# Patient Record
Sex: Female | Born: 1955 | Race: Black or African American | Hispanic: No | Marital: Married | Smoking: Never smoker
Health system: Southern US, Community
[De-identification: ages and names within clinical notes are randomized; demographics above are authoritative.]

## PROBLEM LIST (undated history)

## (undated) DIAGNOSIS — O3410 Maternal care for benign tumor of corpus uteri, unspecified trimester: Secondary | ICD-10-CM

## (undated) DIAGNOSIS — M1990 Unspecified osteoarthritis, unspecified site: Secondary | ICD-10-CM

## (undated) DIAGNOSIS — D259 Leiomyoma of uterus, unspecified: Secondary | ICD-10-CM

## (undated) DIAGNOSIS — T7840XA Allergy, unspecified, initial encounter: Secondary | ICD-10-CM

## (undated) DIAGNOSIS — N83209 Unspecified ovarian cyst, unspecified side: Secondary | ICD-10-CM

## (undated) HISTORY — PX: POPLITEAL SYNOVIAL CYST EXCISION: SUR555

## (undated) HISTORY — PX: TUBAL LIGATION: SHX77

## (undated) HISTORY — DX: Unspecified ovarian cyst, unspecified side: N83.209

## (undated) HISTORY — DX: Maternal care for benign tumor of corpus uteri, unspecified trimester: D25.9

## (undated) HISTORY — DX: Allergy, unspecified, initial encounter: T78.40XA

## (undated) HISTORY — DX: Leiomyoma of uterus, unspecified: O34.10

## (undated) HISTORY — DX: Unspecified osteoarthritis, unspecified site: M19.90

---

## 2004-12-31 ENCOUNTER — Ambulatory Visit: Payer: Self-pay | Admitting: Family Medicine

## 2005-01-07 HISTORY — PX: LEEP: SHX91

## 2006-05-19 ENCOUNTER — Other Ambulatory Visit: Admission: RE | Admit: 2006-05-19 | Discharge: 2006-05-19 | Payer: Self-pay | Admitting: Family Medicine

## 2006-05-19 ENCOUNTER — Encounter: Payer: Self-pay | Admitting: Family Medicine

## 2006-05-19 ENCOUNTER — Ambulatory Visit: Payer: Self-pay | Admitting: Family Medicine

## 2006-05-19 LAB — CONVERTED CEMR LAB: Pap Smear: NORMAL

## 2007-03-12 ENCOUNTER — Encounter: Payer: Self-pay | Admitting: Family Medicine

## 2007-03-12 ENCOUNTER — Telehealth: Payer: Self-pay | Admitting: Family Medicine

## 2007-03-12 ENCOUNTER — Ambulatory Visit (HOSPITAL_COMMUNITY): Admission: RE | Admit: 2007-03-12 | Discharge: 2007-03-12 | Payer: Self-pay | Admitting: Family Medicine

## 2007-03-12 ENCOUNTER — Ambulatory Visit: Payer: Self-pay | Admitting: Vascular Surgery

## 2007-03-12 ENCOUNTER — Ambulatory Visit: Payer: Self-pay | Admitting: Family Medicine

## 2007-03-18 ENCOUNTER — Ambulatory Visit: Payer: Self-pay | Admitting: Orthopedic Surgery

## 2007-04-23 ENCOUNTER — Encounter: Payer: Self-pay | Admitting: Family Medicine

## 2007-04-23 DIAGNOSIS — J309 Allergic rhinitis, unspecified: Secondary | ICD-10-CM | POA: Insufficient documentation

## 2007-04-23 DIAGNOSIS — J45909 Unspecified asthma, uncomplicated: Secondary | ICD-10-CM | POA: Insufficient documentation

## 2007-08-03 ENCOUNTER — Encounter: Payer: Self-pay | Admitting: Family Medicine

## 2007-08-03 ENCOUNTER — Ambulatory Visit: Payer: Self-pay | Admitting: Family Medicine

## 2007-08-03 ENCOUNTER — Other Ambulatory Visit: Admission: RE | Admit: 2007-08-03 | Discharge: 2007-08-03 | Payer: Self-pay | Admitting: Family Medicine

## 2007-08-03 DIAGNOSIS — N852 Hypertrophy of uterus: Secondary | ICD-10-CM | POA: Insufficient documentation

## 2007-08-04 LAB — CONVERTED CEMR LAB
ALT: 16 units/L (ref 0–35)
AST: 19 units/L (ref 0–37)
Albumin: 3.7 g/dL (ref 3.5–5.2)
Alkaline Phosphatase: 50 units/L (ref 39–117)
BUN: 8 mg/dL (ref 6–23)
Basophils Absolute: 0 10*3/uL (ref 0.0–0.1)
Basophils Relative: 0.4 % (ref 0.0–1.0)
Bilirubin, Direct: 0.1 mg/dL (ref 0.0–0.3)
CO2: 29 meq/L (ref 19–32)
Calcium: 9.1 mg/dL (ref 8.4–10.5)
Chloride: 102 meq/L (ref 96–112)
Cholesterol: 172 mg/dL (ref 0–200)
Creatinine, Ser: 0.7 mg/dL (ref 0.4–1.2)
Eosinophils Absolute: 0.2 10*3/uL (ref 0.0–0.6)
Eosinophils Relative: 6.5 % — ABNORMAL HIGH (ref 0.0–5.0)
GFR calc Af Amer: 114 mL/min
GFR calc non Af Amer: 95 mL/min
Glucose, Bld: 81 mg/dL (ref 70–99)
HCT: 40.3 % (ref 36.0–46.0)
HDL: 63.9 mg/dL (ref >39.0)
Hemoglobin: 13.8 g/dL (ref 12.0–15.0)
LDL Cholesterol: 95 mg/dL (ref 0–99)
Lymphocytes Relative: 32 % (ref 12.0–46.0)
MCHC: 34.1 g/dL (ref 30.0–36.0)
MCV: 91.3 fL (ref 78.0–100.0)
Monocytes Absolute: 0.4 10*3/uL (ref 0.2–0.7)
Monocytes Relative: 9.6 % (ref 3.0–11.0)
Neutro Abs: 2 10*3/uL (ref 1.4–7.7)
Neutrophils Relative %: 51.5 % (ref 43.0–77.0)
Platelets: 296 10*3/uL (ref 150–400)
Potassium: 3.7 meq/L (ref 3.5–5.1)
RBC: 4.42 M/uL (ref 3.87–5.11)
RDW: 11.5 % (ref 11.5–14.6)
Sodium: 138 meq/L (ref 135–145)
TSH: 1.13 microintl units/mL (ref 0.35–5.50)
Total Bilirubin: 0.6 mg/dL (ref 0.3–1.2)
Total CHOL/HDL Ratio: 2.7
Total Protein: 6.8 g/dL (ref 6.0–8.3)
Triglycerides: 64 mg/dL (ref 0–149)
VLDL: 13 mg/dL (ref 0–40)
WBC: 3.8 10*3/uL — ABNORMAL LOW (ref 4.5–10.5)

## 2007-08-10 ENCOUNTER — Encounter (INDEPENDENT_AMBULATORY_CARE_PROVIDER_SITE_OTHER): Payer: Self-pay | Admitting: *Deleted

## 2007-08-10 LAB — CONVERTED CEMR LAB: Pap Smear: NORMAL

## 2007-08-28 ENCOUNTER — Encounter: Payer: Self-pay | Admitting: Family Medicine

## 2007-08-29 DIAGNOSIS — D259 Leiomyoma of uterus, unspecified: Secondary | ICD-10-CM | POA: Insufficient documentation

## 2007-10-29 ENCOUNTER — Encounter: Payer: Self-pay | Admitting: Family Medicine

## 2007-11-02 ENCOUNTER — Encounter (INDEPENDENT_AMBULATORY_CARE_PROVIDER_SITE_OTHER): Payer: Self-pay | Admitting: *Deleted

## 2007-11-17 ENCOUNTER — Telehealth: Payer: Self-pay | Admitting: Family Medicine

## 2007-11-27 ENCOUNTER — Encounter: Payer: Self-pay | Admitting: Family Medicine

## 2008-02-15 ENCOUNTER — Ambulatory Visit: Payer: Self-pay | Admitting: Family Medicine

## 2008-02-16 ENCOUNTER — Ambulatory Visit: Payer: Self-pay | Admitting: Family Medicine

## 2008-02-25 ENCOUNTER — Ambulatory Visit: Payer: Self-pay | Admitting: Family Medicine

## 2008-08-12 ENCOUNTER — Ambulatory Visit: Payer: Self-pay | Admitting: Internal Medicine

## 2008-10-07 ENCOUNTER — Ambulatory Visit: Payer: Self-pay | Admitting: Family Medicine

## 2008-10-07 ENCOUNTER — Other Ambulatory Visit: Admission: RE | Admit: 2008-10-07 | Discharge: 2008-10-07 | Payer: Self-pay | Admitting: Family Medicine

## 2008-10-07 ENCOUNTER — Encounter: Payer: Self-pay | Admitting: Family Medicine

## 2008-10-12 ENCOUNTER — Encounter (INDEPENDENT_AMBULATORY_CARE_PROVIDER_SITE_OTHER): Payer: Self-pay | Admitting: *Deleted

## 2008-12-06 ENCOUNTER — Ambulatory Visit: Payer: Self-pay | Admitting: Family Medicine

## 2008-12-07 LAB — CONVERTED CEMR LAB
ALT: 21 units/L (ref 0–35)
AST: 18 units/L (ref 0–37)
Albumin: 3.5 g/dL (ref 3.5–5.2)
Alkaline Phosphatase: 54 units/L (ref 39–117)
BUN: 7 mg/dL (ref 6–23)
Basophils Absolute: 0 10*3/uL (ref 0.0–0.1)
Basophils Relative: 0.3 % (ref 0.0–3.0)
Bilirubin, Direct: 0.1 mg/dL (ref 0.0–0.3)
CO2: 30 meq/L (ref 19–32)
Calcium: 9 mg/dL (ref 8.4–10.5)
Chloride: 107 meq/L (ref 96–112)
Cholesterol: 175 mg/dL (ref 0–200)
Creatinine, Ser: 0.7 mg/dL (ref 0.4–1.2)
Eosinophils Absolute: 0.2 10*3/uL (ref 0.0–0.7)
Eosinophils Relative: 5 % (ref 0.0–5.0)
GFR calc non Af Amer: 113.35 mL/min (ref >60)
Glucose, Bld: 96 mg/dL (ref 70–99)
HCT: 41.5 % (ref 36.0–46.0)
HDL: 56.1 mg/dL (ref >39.00)
Hemoglobin: 13.8 g/dL (ref 12.0–15.0)
LDL Cholesterol: 107 mg/dL — ABNORMAL HIGH (ref 0–99)
Lymphocytes Relative: 35.4 % (ref 12.0–46.0)
Lymphs Abs: 1.2 10*3/uL (ref 0.7–4.0)
MCHC: 33.2 g/dL (ref 30.0–36.0)
MCV: 92 fL (ref 78.0–100.0)
Monocytes Absolute: 0.3 10*3/uL (ref 0.1–1.0)
Monocytes Relative: 9.1 % (ref 3.0–12.0)
Neutro Abs: 1.8 10*3/uL (ref 1.4–7.7)
Neutrophils Relative %: 50.2 % (ref 43.0–77.0)
Platelets: 305 10*3/uL (ref 150.0–400.0)
Potassium: 4.2 meq/L (ref 3.5–5.1)
RBC: 4.52 M/uL (ref 3.87–5.11)
RDW: 12.2 % (ref 11.5–14.6)
Sodium: 142 meq/L (ref 135–145)
TSH: 1.94 microintl units/mL (ref 0.35–5.50)
Total Bilirubin: 0.7 mg/dL (ref 0.3–1.2)
Total CHOL/HDL Ratio: 3
Total Protein: 6.4 g/dL (ref 6.0–8.3)
Triglycerides: 59 mg/dL (ref 0.0–149.0)
VLDL: 11.8 mg/dL (ref 0.0–40.0)
WBC: 3.5 10*3/uL — ABNORMAL LOW (ref 4.5–10.5)

## 2009-05-12 ENCOUNTER — Telehealth: Payer: Self-pay | Admitting: Family Medicine

## 2009-07-21 ENCOUNTER — Ambulatory Visit: Payer: Self-pay | Admitting: Family Medicine

## 2009-07-25 ENCOUNTER — Encounter: Payer: Self-pay | Admitting: Family Medicine

## 2009-08-02 ENCOUNTER — Encounter (INDEPENDENT_AMBULATORY_CARE_PROVIDER_SITE_OTHER): Payer: Self-pay | Admitting: *Deleted

## 2009-08-02 LAB — HM MAMMOGRAPHY: HM Mammogram: NORMAL

## 2009-10-25 ENCOUNTER — Other Ambulatory Visit: Admission: RE | Admit: 2009-10-25 | Discharge: 2009-10-25 | Payer: Self-pay | Admitting: Family Medicine

## 2009-10-25 ENCOUNTER — Ambulatory Visit: Payer: Self-pay | Admitting: Family Medicine

## 2009-10-25 ENCOUNTER — Encounter (INDEPENDENT_AMBULATORY_CARE_PROVIDER_SITE_OTHER): Payer: Self-pay | Admitting: *Deleted

## 2009-10-25 LAB — HM PAP SMEAR

## 2009-10-27 LAB — CONVERTED CEMR LAB
ALT: 20 units/L (ref 0–35)
AST: 18 units/L (ref 0–37)
Albumin: 3.6 g/dL (ref 3.5–5.2)
Alkaline Phosphatase: 60 units/L (ref 39–117)
BUN: 6 mg/dL (ref 6–23)
Basophils Absolute: 0 10*3/uL (ref 0.0–0.1)
Basophils Relative: 0.9 % (ref 0.0–3.0)
Bilirubin, Direct: 0.1 mg/dL (ref 0.0–0.3)
CO2: 31 meq/L (ref 19–32)
Calcium: 8.7 mg/dL (ref 8.4–10.5)
Chloride: 106 meq/L (ref 96–112)
Cholesterol: 169 mg/dL (ref 0–200)
Creatinine, Ser: 0.7 mg/dL (ref 0.4–1.2)
Eosinophils Absolute: 0.2 10*3/uL (ref 0.0–0.7)
Eosinophils Relative: 6.3 % — ABNORMAL HIGH (ref 0.0–5.0)
GFR calc non Af Amer: 112.96 mL/min (ref >60)
Glucose, Bld: 83 mg/dL (ref 70–99)
HCT: 36.7 % (ref 36.0–46.0)
HDL: 68.8 mg/dL (ref >39.00)
Hemoglobin: 12.5 g/dL (ref 12.0–15.0)
LDL Cholesterol: 91 mg/dL (ref 0–99)
Lymphocytes Relative: 30.4 % (ref 12.0–46.0)
Lymphs Abs: 1.2 10*3/uL (ref 0.7–4.0)
MCHC: 33.9 g/dL (ref 30.0–36.0)
MCV: 90.9 fL (ref 78.0–100.0)
Monocytes Absolute: 0.4 10*3/uL (ref 0.1–1.0)
Monocytes Relative: 9.4 % (ref 3.0–12.0)
Neutro Abs: 2 10*3/uL (ref 1.4–7.7)
Neutrophils Relative %: 53 % (ref 43.0–77.0)
Platelets: 272 10*3/uL (ref 150.0–400.0)
Potassium: 3.9 meq/L (ref 3.5–5.1)
RBC: 4.04 M/uL (ref 3.87–5.11)
RDW: 11.9 % (ref 11.5–14.6)
Sodium: 140 meq/L (ref 135–145)
TSH: 1.28 microintl units/mL (ref 0.35–5.50)
Total Bilirubin: 0.2 mg/dL — ABNORMAL LOW (ref 0.3–1.2)
Total CHOL/HDL Ratio: 2
Total Protein: 6.3 g/dL (ref 6.0–8.3)
Triglycerides: 45 mg/dL (ref 0.0–149.0)
VLDL: 9 mg/dL (ref 0.0–40.0)
WBC: 3.8 10*3/uL — ABNORMAL LOW (ref 4.5–10.5)

## 2009-11-01 ENCOUNTER — Encounter (INDEPENDENT_AMBULATORY_CARE_PROVIDER_SITE_OTHER): Payer: Self-pay | Admitting: *Deleted

## 2009-11-01 LAB — CONVERTED CEMR LAB: Pap Smear: NEGATIVE

## 2009-11-15 ENCOUNTER — Encounter (INDEPENDENT_AMBULATORY_CARE_PROVIDER_SITE_OTHER): Payer: Self-pay | Admitting: *Deleted

## 2009-11-17 ENCOUNTER — Ambulatory Visit: Payer: Self-pay | Admitting: Gastroenterology

## 2009-11-23 ENCOUNTER — Ambulatory Visit: Payer: Self-pay | Admitting: Family Medicine

## 2009-12-01 ENCOUNTER — Ambulatory Visit: Payer: Self-pay | Admitting: Gastroenterology

## 2009-12-01 LAB — HM COLONOSCOPY

## 2009-12-04 ENCOUNTER — Telehealth: Payer: Self-pay | Admitting: Family Medicine

## 2009-12-04 ENCOUNTER — Encounter: Payer: Self-pay | Admitting: Family Medicine

## 2010-10-09 NOTE — Assessment & Plan Note (Signed)
Summary: CPX/RBH   Vital Signs:  Patient Profile:   55 Years Old Female Height:     60.5 inches Weight:      196 pounds BMI:     37.78 Temp:     97.5 degrees F oral Pulse rate:   80 / minute Pulse rhythm:   regular BP sitting:   120 / 76  (left arm) Cuff size:   regular  Vitals Entered By: Liane Comber (October 07, 2008 2:26 PM)                 Chief Complaint:  cpx.  History of Present Illness: is due for pap - last nl 08 has had colposcopy and leep proceedure in past does have hx of uterine fibroids on [place] in past  is needing a pap smear is having some spotting between menses  , and no hot flashes yet  menses is pretty regular overall - heavy occas but not painful    mam is due in february - wants to schedule  no breast lumps on self exam   Td utd 2007 did get flu shot in nov  does want H1N1   wt is stable   had some sinus probs and uri- was px singulair and that really helps with zyrtec  ? some chronic bronchial problems  is trying to eat right -- fairly healthy diet  not enough exercise- has a sit down job   never had colonoscopy - ? if would be interested in future no stool/ bowel habit changes   last labs were good - with good cholesterol       Current Allergies (reviewed today): No known allergies   Past Medical History:    Reviewed history from 04/23/2007 and no changes required:       Allergic rhinitis       Asthma- as a child       uterine fibroids       ovarian cyst   Past Surgical History:    Reviewed history from 11/30/2007 and no changes required:       Colposcopy,  LEEP (01/2005)       Baker's cyst (03/2007)       12/08 pelvic US uterine fibroids and small ovarian cyst on R (rec re check cyst in 3 mo)       3/09 pelvic US uterine fibroids (with resolution of ovarian cyst)   Family History:    Reviewed history from 04/23/2007 and no changes required:       Father:        Mother: past heart problems, HTN       Siblings:  sister deceased- cervical cancer age 39  Social History:    Reviewed history from 08/03/2007 and no changes required:       Marital Status: Married       Children: 2       Occupation:        nonsmoker       non drinker    Review of Systems  General      Denies fatigue, loss of appetite, and malaise.  Eyes      Denies blurring and eye pain.  CV      Denies chest pain or discomfort, palpitations, and swelling of feet.  Resp      Denies cough and wheezing.      breathing is better than it was   GI      Denies abdominal pain, bloody stools, and change  in bowel habits.  GU      Denies urinary frequency.  Derm      Denies rash.  Neuro      Denies numbness and tingling.  Psych      mood is ok   Endo      Denies excessive thirst and excessive urination.   Physical Exam  General:     overweight but generally well appearing  Head:     normocephalic, atraumatic, and no abnormalities observed.   Eyes:     vision grossly intact, pupils equal, pupils round, and pupils reactive to light.  no conjunctival pallor, injection or icterus  Ears:     R ear normal and L ear normal.  - scant cerumen Nose:     no nasal discharge.   Mouth:     pharynx pink and moist.   Neck:     supple with full rom and no masses or thyromegally, no JVD or carotid bruit  Breasts:     No mass, nodules, thickening, tenderness, bulging, retraction, inflamation, nipple discharge or skin changes noted.   Lungs:     Normal respiratory effort, chest expands symmetrically. Lungs are clear to auscultation, no crackles or wheezes. Heart:     Normal rate and regular rhythm. S1 and S2 normal without gallop, murmur, click, rub or other extra sounds. Abdomen:     Bowel sounds positive,abdomen soft and non-tender without masses, organomegaly or hernias noted. no renal bruits  uterine enlargement noted with fundus just above pubic bone  Genitalia:     normal introitus, no external lesions, no vaginal  discharge, mucosa pink and moist, no vaginal or cervical lesions, no vaginal atrophy, no friaility or hemorrhage, and no adnexal masses or tenderness.  uterus is enl on palpation- stable from last exam no pelvic or cervical motion tenderness Msk:     No deformity or scoliosis noted of thoracic or lumbar spine.  no acute joint changes  Pulses:     R and L carotid,radial,femoral,dorsalis pedis and posterior tibial pulses are full and equal bilaterally Extremities:     No clubbing, cyanosis, edema, or deformity noted with normal full range of motion of all joints.   Neurologic:     sensation intact to light touch, gait normal, and DTRs symmetrical and normal.   Skin:     Intact without suspicious lesions or rashes Cervical Nodes:     No lymphadenopathy noted Axillary Nodes:     No palpable lymphadenopathy Inguinal Nodes:     No significant adenopathy Psych:     normal affect, talkative and pleasant     Impression & Recommendations:  Problem # 1:  HEALTH MAINTENANCE EXAM (ICD-V70.0) Assessment: Comment Only reviewed health habits including diet, exercise and skin cancer prevention reviewed health maintenance list and family history disc inc exercise for wt loss  disc rec for ca and vit D will schedule fasting labs when able  H1N1  vaccine today  Problem # 2:  ROUTINE GYNECOLOGICAL EXAMINATION (ICD-V72.31) Assessment: Comment Only annual exam done  uterine enl from fibroids is stable on exam- and no symptoms at this time pap done- pend report disc expectations for perimenopause  Problem # 3:  LEIOMYOMA OF UTERUS, 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 Take by mouth as directed 2)  Zyrtec Allergy 10 Mg Tabs (Cetirizine hcl) .... Take 1 tablet by mouth once a day 3)  Singulair 10 Mg Tabs (Montelukast sodium) .... Take 1 tablet by mouth once a day  Other Orders: H1N1 vaccine G code (F6213) Influenza A (H1N1) adm  fee Medicare/Non Medicare [phone])   Patient Instructions: 1)  if you are interested in colonoscpy in future-  can call us to schedule fasting labs whenever able lipid/ wellness v70.0 2)  H1N1 vaccine today 3)  schedule fasting labs when able  4)  we will schedule mammogram at check out  5)  keep working on healthy diet and exercise  6)  the current recommendation for calcium intake is 1200-1500 mg daily with [phone] IU of vitamin D    Prescriptions: SINGULAIR 10 MG TABS (MONTELUKAST SODIUM) Take 1 tablet by mouth once a day  #30 x 11   Entered by:   Judith Part MD   Authorized by:   Gildardo Griffes FNP   Signed by:   Judith Part MD on 10/07/2008   Method used:   Print then Give to Patient   RxID:   [id number]    Preventive Care Screening  Last Flu Shot:    Date:  07/10/2008    Results:  given    H1N1 # 1    Vaccine Type: H1N1 vaccine G code    Site: left deltoid    Mfr: Sanofi Pasteur    Dose: 0.5 ml    Route: IM    Given by: Liane Comber    Exp. Date: 01/23/2010    Lot #: [id number]    VIS given: 06/09/2009 given October 07, 2008.

## 2010-10-09 NOTE — Letter (Signed)
Summary: Out of Work  [employer] at [facility]  [address] [place], [place] [address]   Phone: [phone]  Fax: [phone]    December 04, 2009   Employee:  Isabella Ramirez    To Whom It May Concern:   For Medical reasons, please excuse the above named employee from work for the following dates:  Start:   11/22/2009  End:   11/26/2009  If you need additional information, please feel free to contact our office.         Sincerely,    Shaune Leeks MD

## 2010-10-09 NOTE — Assessment & Plan Note (Signed)
Summary: COUGH/CONGESTION/DLO   Vital Signs:  Patient profile:   55 year old female Weight:      194.25 pounds O2 Sat:      93 % on Room air Temp:     98.8 degrees F oral Pulse rate:   88 / minute Pulse rhythm:   regular Resp:     24 per minute BP sitting:   104 / 68  (left arm) Cuff size:   large  Vitals Entered By: Sydell Axon LPN (November 23, 2009 10:11 AM)  O2 Flow:  Room air CC: Productive cough/yellow and congestion   History of Present Illness: Pt of Dr Milinda Antis here for cough and congestion. She is listed as having Asthma but is not routinely treated for it. Her sxs began three days ago. She has chills, hasn't taken her temp, slept all day yesterday, she had headache yesterday in the right temporal area, no ear pain, rhinitis that is clear, no nasal congestion, mild burning of the throat, cough productive of brown now yellow all day long. She has SOB yesterday, not today, no N/V. She has taken Theraflu cold /severe.   Problems Prior to Update: 1)  Screening For Malignant Neoplasm of The Rectum  (ICD-V76.41) 2)  Other Screening Mammogram  (ICD-V76.12) 3)  Leiomyoma of Uterus, Unspecified  (ICD-218.9) 4)  Uterine Enlargement  (ICD-621.2) 5)  Routine Gynecological Examination  (ICD-V72.31) 6)  Health Maintenance Exam  (ICD-V70.0) 7)  Asthma  (ICD-493.90) 8)  Allergic Rhinitis  (ICD-477.9)  Medications Prior to Update: 1)  Multi-Vitamin   Tabs (Multiple Vitamin) .... Take By Mouth As Directed 2)  Zyrtec Allergy 10 Mg  Tabs (Cetirizine Hcl) .... Take 1 Tablet By Mouth Once A Day 3)  Singulair 10 Mg Tabs (Montelukast Sodium) .... Take 1 Tablet By Mouth Once A Day 4)  Vitamin D 400 Unit  Tabs (Cholecalciferol) .... One Daily 5)  Alka-Seltzer Plus Cold 2-7.8-325 Mg Tbef (Chlorphen-Phenyleph-Asa) .... Otc As Directed. 6)  Robitussin Dm 100-10 Mg/48ml Syrp (Dextromethorphan-Guaifenesin) .... Otc As Directed. 7)  Calcium Citrate   Powd (Calcium Citrate Tetrahydrate) .... Takes One  Daily 8)  Moviprep 100 Gm  Solr (Peg-Kcl-Nacl-Nasulf-Na Asc-C) .... As Per Prep Instructions.  Allergies: No Known Drug Allergies  Physical Exam  General:  Well-developed,well-nourished,in no acute distress; alert,appropriate and cooperative throughout examination, wet frequent cough. Head:  normocephalic, atraumatic, and no abnormalities observed.  Sinuses NT. Eyes:  Conjunctiva clear bilaterally.  Ears:  R ear occluded with cerumen and L ear normal wioth sm amt cerumen.   Nose:  Inflamed L>R with thick tan discharge Mouth:  Oral mucosa and oropharynx without lesions or exudates.  Teeth in good repair. Neck:  supple with full rom and no masses or thyromegally, no JVD or carotid bruit  Chest Wall:  No deformities, masses, or tenderness noted. Lungs:  Normal respiratory effort, chest expands symmetrically. Lungs with mildcrackles and end expiratory wheezes. Heart:  Normal rate and regular rhythm. S1 and S2 normal without gallop, murmur, click, rub or other extra sounds.   Impression & Recommendations:  Problem # 1:  BRONCHITIS- ACUTE (ICD-466.0) Assessment New  See instructions. The following medications were removed from the medication list:    Robitussin Dm 100-10 Mg/67ml Syrp (Dextromethorphan-guaifenesin) ..... Otc as directed. Her updated medication list for this problem includes:    Singulair 10 Mg Tabs (Montelukast sodium) .Marland Kitchen... Take 1 tablet by mouth once a day    Alka-seltzer Plus Cold 2-7.8-325 Mg Tbef (Chlorphen-phenyleph-asa) ..... Otc as directed.  Zithromax Z-pak 250 Mg Tabs (Azithromycin) .Marland Kitchen... As dir  Take antibiotics and other medications as directed. Encouraged to push clear liquids, get enough rest, and take acetaminophen as needed. To be seen in 5-7 days if no improvement, sooner if worse.  Complete Medication List: 1)  Multi-vitamin Tabs (Multiple vitamin) .... Take by mouth as directed 2)  Zyrtec Allergy 10 Mg Tabs (Cetirizine hcl) .... Take 1 tablet by  mouth once a day 3)  Singulair 10 Mg Tabs (Montelukast sodium) .... Take 1 tablet by mouth once a day 4)  Vitamin D 400 Unit Tabs (Cholecalciferol) .... One daily 5)  Alka-seltzer Plus Cold 2-7.8-325 Mg Tbef (Chlorphen-phenyleph-asa) .... Otc as directed. 6)  Calcium Citrate Powd (Calcium citrate tetrahydrate) .... Takes one daily 7)  Moviprep 100 Gm Solr (Peg-kcl-nacl-nasulf-na asc-c) .... As per prep instructions. 8)  Zithromax Z-pak 250 Mg Tabs (Azithromycin) .... As dir  Patient Instructions: 1)  Take Zithromax 2)  Take Guaifenesin by [facility] or [facility] and getting MUCOUS RELIEF EXPECTORANT (400mg ), take 11/2 tabs by mouth AM and NOON. 3)  Drink lots of fluids anytime taking Guaifenesin.  4)  Take Tyl ES 2 tabs three times a day  5)  Gargle every 1/2 hr for two days 6)  Keep lozenge in mouth  7)  RTC if sxs don't improve. Prescriptions: ZITHROMAX Z-PAK 250 MG TABS (AZITHROMYCIN) as dir  #1 Pak x 0   Entered and Authorized by:   Shaune Leeks MD   Signed by:   Shaune Leeks MD on 11/23/2009   Method used:   Electronically to        [facility]  [address] (retail)       [address]       [place], [place]  [address]       Ph: [phone] or [phone]       Fax: [phone]   RxID:   [id number]   Current Allergies (reviewed today): No known allergies

## 2010-10-09 NOTE — Assessment & Plan Note (Signed)
Summary: COLD/COUGHING/DLO   Vital Signs:  Patient Profile:   55 Years Old Female Height:     61 inches Weight:      190 pounds Temp:     99.6 degrees F oral Pulse rate:   119 / minute BP sitting:   97 / 66  (right arm) Cuff size:   regular  Vitals Entered By: Cooper Render (February 15, 2008 10:53 AM)                 Chief Complaint:  URI sx, cough, and tightness in chest.  History of Present Illness: Here for cough and URI sx--tight in chest --onset yeaterday with breathing tight since onset.  Cough most of the night.  Had asthma as a child, does have allergic rhinitis.  Has  needed Advair several yrs ago with bronchitis.  No work today --Taking nothing --wheezing    Current Allergies (reviewed today): No known allergies   Past Medical History:    Reviewed history from 04/23/2007 and no changes required:       Allergic rhinitis       Asthma- as a child     Review of Systems      See HPI   Physical Exam  General:     alert, well-developed, well-nourished, and well-hydrated.  anxious, wheeze audible in exam room Ears:     R ear normal and L ear normal.   Nose:     boggy and edematus, no airflow obstruction.  sinuses neg Mouth:     no exudates and pharyngeal erythema.   Lungs:     wheezing audible in room, occuring throughtout inhale and exhale.  11:05 HHNeb #1 ewith Xopenex started 11:40 clear but feels tight in chest,  #2 HHneb of Xopenex started 12:10 clear and breathing easier, less anxious  Cervical Nodes:     no anterior cervical adenopathy and no posterior cervical adenopathy.   Psych:     normally interactive, good eye contact, labile affect, tearful, and slightly anxious.      Impression & Recommendations:  Problem # 1:  WHEEZING (ICD-786.07) Assessment: New due to dramatic presentation, gave Dexamethasone 10mg  Im start 10mg  prednisone taper pack in am will begin Advair 100/50 tonight--sample given increase water work note tomorrow as  needed Orders: Dexamethasone 1mg  (J1100) Admin of Therapeutic Inj  intramuscular or subcutaneous (32355) Nebulizer Tx (73220) Nebulizer Tx (25427)   Problem # 2:  BRONCHITIS-ACUTE (ICD-466.0) Assessment: New suspectis viral due to sudden onset  will see back tomorrow for follow up--start ABT if indicated Her updated medication list for this problem includes:    Advair Diskus 100-50 Mcg/dose Misc (Fluticasone-salmeterol) .Marland Kitchen... 1 puff two times a day   Complete Medication List: 1)  Zyrtec Allergy 10 Mg Tabs (Cetirizine hcl) .... One by mouth qd 2)  Multi-vitamin Tabs (Multiple vitamin) .... Take by mouth as directed 3)  Prednisone (pak) 10 Mg Tabs (Prednisone) .... Use as directed x 6d 4)  Advair Diskus 100-50 Mcg/dose Misc (Fluticasone-salmeterol) .Marland Kitchen.. 1 puff two times a day   Patient Instructions: 1)  see back in the morning   Prescriptions: PREDNISONE (PAK) 10 MG  TABS (PREDNISONE) use as directed x 6d  #1 x 0   Entered and Authorized by:   Gildardo Griffes FNP   Signed by:   Gildardo Griffes FNP on 02/15/2008   Method used:   Print then Give to Patient   RxID:   [id number]  ] Prior Medications (reviewed today):  ZYRTEC ALLERGY 10 MG  TABS (CETIRIZINE HCL) one by mouth qd MULTI-VITAMIN   TABS (MULTIPLE VITAMIN) take by mouth as directed Current Allergies (reviewed today): No known allergies    Medication Administration  Injection # 1:    Medication: Dexamethasone 1mg     Diagnosis: WHEEZING (ICD-786.07)    Route: IM    Site: R deltoid    Exp Date: 06/09/2008    Lot #: [id number]    Mfr: baxter    Comments: 10mg  to be given    Patient tolerated injection without complications    Given by: Cooper Render (February 15, 2008 11:17 AM)  Medication # 1:    Medication: Xopenex 1.25mg     Diagnosis: WHEEZING (ICD-786.07)    Dose: 1.25/57ml    Route: inhaled    Exp Date: 11/09    Lot #: [id number]    Mfr: sepracor    Given by: Gildardo Griffes FNP  (February 15, 2008 1:38 PM)  Medication # 2:    Medication: Xopenex 1.25mg     Diagnosis: WHEEZING (ICD-786.07)    Dose: 1.25/68ml    Route: inhaled    Exp Date: 11/09    Lot #: [id number]    Mfr: sepracor    Given by: Gildardo Griffes FNP (February 15, 2008 1:39 PM)  Orders Added: 1)  Dexamethasone 1mg  [J1100] 2)  Admin of Therapeutic Inj  intramuscular or subcutaneous [96372] 3)  Est. Patient Level III [41660] 4)  Nebulizer Tx [94640] 5)  Nebulizer Tx [63016]   Medication Administration  Injection # 1:    Medication: Dexamethasone 1mg     Diagnosis: WHEEZING (ICD-786.07)    Route: IM    Site: R deltoid    Exp Date: 06/09/2008    Lot #: [id number]    Mfr: baxter    Comments: 10mg  to be given    Patient tolerated injection without complications    Given by: Cooper Render (February 15, 2008 11:17 AM)  Medication # 1:    Medication: Xopenex 1.25mg     Diagnosis: WHEEZING (ICD-786.07)    Dose: 1.25/71ml    Route: inhaled    Exp Date: 11/09    Lot #: [id number]    Mfr: sepracor    Given by: Gildardo Griffes FNP (February 15, 2008 1:38 PM)  Medication # 2:    Medication: Xopenex 1.25mg     Diagnosis: WHEEZING (ICD-786.07)    Dose: 1.25/64ml    Route: inhaled    Exp Date: 11/09    Lot #: [id number]    Mfr: sepracor    Given by: Gildardo Griffes FNP (February 15, 2008 1:39 PM)  Orders Added: 1)  Dexamethasone 1mg  [J1100] 2)  Admin of Therapeutic Inj  intramuscular or subcutaneous [96372] 3)  Est. Patient Level III [70623] 4)  Nebulizer Tx [94640] 5)  Nebulizer Tx [76283]

## 2010-10-09 NOTE — Letter (Signed)
Summary: Results Follow up Letter  [place] at [facility]  [address] [place], [place] [address]   Phone: [phone]  Fax: [phone]    11/01/2009 MRN: [id number]    Isabella Ramirez [address] [place], [place]  [address]    Dear Isabella Ramirez,  The following are the results of your recent test(s):  Test         Result    Pap Smear:        Normal __X___  Not Normal _____ Comments: ______________________________________________________ Cholesterol: LDL(Bad cholesterol):         Your goal is less than:         HDL (Good cholesterol):       Your goal is more than: Comments:  ______________________________________________________ Mammogram:        Normal _____  Not Normal _____ Comments:  ___________________________________________________________________ Hemoccult:        Normal _____  Not normal _______ Comments:    _____________________________________________________________________ Other Tests:    We routinely do not discuss normal results over the telephone.  If you desire a copy of the results, or you have any questions about this information we can discuss them at your next office visit.   Sincerely,    Marne A. Milinda Antis, M.D.  MAT:lsf

## 2010-10-09 NOTE — Progress Notes (Signed)
  Phone Note From Other Clinic   Summary of Call: Will at [facility] called w/ Results. No DVT. Large intact Baker's Cyst behind L Knee. Initial call taken by: Beau Fanny,  March 12, 2007 3:43 PM  Follow-up for Phone Call        already aware and action taken Follow-up by: Judith Part MD,  March 14, 2007 11:48 AM

## 2010-10-09 NOTE — Letter (Signed)
Summary: Generic Letter  [place] at [facility]  [address] [place], [place] [address]   Phone: [phone]  Fax: [phone]    10/12/2008    Isabella Ramirez [address] [place], [place]  [address]    Dear Ms. Mathe,    Your pap smear was normal, please repeat screening in one year     Sincerely,   [place] at [facility]

## 2010-10-09 NOTE — Assessment & Plan Note (Signed)
Summary: KNOT ON BACK OF KNEE/CLE   Vital Signs:  Patient Profile:   55 Years Old Female Weight:      174.25 pounds Temp:     97.7 degrees F oral Pulse rate:   76 / minute BP sitting:   110 / 60  (left arm)  Vitals Entered By: Wandra Mannan (March 12, 2007 11:12 AM)               Chief Complaint:  knot on back of L leg.  History of Present Illness: pt had really bad cramp in L leg a week ago a more consistent ache developed, with soreness last nt not hot to the touch, but now she can feel a knot in back of knee is a little tender to the touch  is not on any hormones, no hx of blool clots, and no smoking for years no sob or cp did try some icy heat        Review of Systems  General      Denies chills, fatigue, and fever.  CV      Denies chest pain or discomfort, shortness of breath with exertion, and swelling of feet.  Resp      Denies pleuritic and shortness of breath.  MS      Denies joint pain, joint redness, joint swelling, and loss of strength.  Derm      Denies changes in color of skin and rash.  Neuro      Denies numbness.  Heme      Denies abnormal bruising.   Physical Exam  General:     wt is up 10 lb since last visit, overwt but well app Head:     Normocephalic and atraumatic without obvious abnormalities. No apparent alopecia or balding. Eyes:     pupils equal, pupils round, and pupils reactive to light.   Neck:     No deformities, masses, or tenderness noted.no thyromegaly, no JVD, and no carotid bruits.   Lungs:     Normal respiratory effort, chest expands symmetrically. Lungs are clear to auscultation, no crackles or wheezes. Heart:     Normal rate and regular rhythm. S1 and S2 normal without gallop, murmur, click, rub or other extra sounds. Msk:     some focal tenderness and fullness behind L knee no calf tenderness or homan's sign no redness, warmth, or eccymosis gait favors other leg nl rom foot and leg no joint swelling  or crepitice Pulses:     R and L carotid,radial,femoral,dorsalis pedis and posterior tibial pulses are full and equal bilaterally Extremities:     no LE edema except fullness behind knee Neurologic:     strength normal in all extremities, sensation intact to light touch, gait normal, and DTRs symmetrical and normal.   Skin:     turgor normal, color normal, no rashes, no ecchymoses, and no petechiae.   Inguinal Nodes:     No significant adenopathy Psych:     nl affect, pleasant    Impression & Recommendations:  Problem # 1:  LEG PAIN, LEFT (ICD-729.5) suspicous for strain or possibly baker's cyst will schedule doppler for further eval and to rule out DVT as well Orders: Ultrasound (Ultrasound)   Problem # 2:  SYMP SWELL/MASS/LUMP, LOCALIZED SUPERFICIAL (ICD-782.2) see above Orders: Ultrasound (Ultrasound)    Patient Instructions: 1)  use heat gently on area that hurts 2)  elevate it as much as possible 3)  tylenol or advil over the counter is  ok if needed 4)  we will schedule the doppler test on your way out

## 2010-10-09 NOTE — Letter (Signed)
Summary: Previsit letter  [facility]  [address] [place], [place] [address]   Phone: [phone]  Fax: [phone]       10/25/2009 MRN: [id number]  Isabella Ramirez [address] [place], [place]  [address]  Dear Ms. Boran,  Welcome to the Gastroenterology Division at [facility].    You are scheduled to see a nurse for your pre-procedure visit on 11-17-09 at 1:30pm on the 3rd floor at [facility], [address]  We ask that you try to arrive at our office 15 minutes prior to your appointment time to allow for check-in.  Your nurse visit will consist of discussing your medical and surgical history, your immediate family medical history, and your medications.    Please bring a complete list of all your medications or, if you prefer, bring the medication bottles and we will list them.  We will need to be aware of both prescribed and over the counter drugs.  We will need to know exact dosage information as well.  If you are on blood thinners (Coumadin, Plavix, Aggrenox, Ticlid, etc.) please call our office today/prior to your appointment, as we need to consult with your physician about holding your medication.   Please be prepared to read and sign documents such as consent forms, a financial agreement, and acknowledgement forms.  If necessary, and with your consent, a friend or relative is welcome to sit-in on the nurse visit with you.  Please bring your insurance card so that we may make a copy of it.  If your insurance requires a referral to see a specialist, please bring your referral form from your primary care physician.  No co-pay is required for this nurse visit.     If you cannot keep your appointment, please call [phone] to cancel or reschedule prior to your appointment date.  This allows [place] the opportunity to schedule an appointment for another patient in need of care.    Thank you for choosing [facility] for your medical needs.   We appreciate the opportunity to care for you.  Please visit [place] at our website  to learn more about our practice.                     Sincerely.                                                                                                                   The Gastroenterology Division

## 2010-10-09 NOTE — Assessment & Plan Note (Signed)
Summary: CPX W/PAP/RBH   Vital Signs:  Patient Profile:   55 Years Old Female Height:     61 inches Weight:      182 pounds Temp:     97.8 degrees F oral Pulse rate:   76 / minute Pulse rhythm:   regular BP sitting:   110 / 80  (left arm) Cuff size:   large  Vitals Entered By: Lowella Petties (August 03, 2007 9:30 AM)                 Chief Complaint:  30 minute check up.  History of Present Illness: is doing fine overall hectic year still perimenupausal- still periods- some times not as regular needs mam had hot flashes /nt sweats wants to wait on colonosc    Current Allergies: No known allergies    Social History:    Marital Status: Married    Children: 2    Occupation:     nonsmoker    non drink    Review of Systems      See HPI  General      Denies chills, fatigue, fever, loss of appetite, and weight loss.  Eyes      Denies blurring.  CV      Denies chest pain or discomfort, palpitations, and shortness of breath with exertion.  Resp      Denies cough.  GI      Denies bloody stools and change in bowel habits.      will consider colonosc next year  GU      Denies discharge, dysuria, and urinary frequency.      menses is heavy- has always been  Derm      Denies changes in color of skin, lesion(s), and rash.  Psych      mood is ok  Endo      Denies excessive thirst and excessive urination.   Physical Exam  General:     overweight but generally well appearing  Head:     normocephalic, atraumatic, no abnormalities observed, and no abnormalities palpated.   Eyes:     vision grossly intact.   Ears:     R ear normal and L ear normal.  - scant cerumen Nose:     no nasal discharge.   Mouth:     pharynx pink and moist.   Neck:     supple with full rom and no masses or thyromegally, no JVD or carotid bruit  Breasts:     No mass, nodules, thickening, tenderness, bulging, retraction, inflamation, nipple discharge or skin changes  noted.   Lungs:     Normal respiratory effort, chest expands symmetrically. Lungs are clear to auscultation, no crackles or wheezes. Heart:     Normal rate and regular rhythm. S1 and S2 normal without gallop, murmur, click, rub or other extra sounds. Abdomen:     Bowel sounds positive,abdomen soft and non-tender without masses, organomegaly or hernias noted. Rectal:     No external abnormalities noted. Normal sphincter tone. No rectal masses or tenderness.- heme neg stool  Genitalia:     normal introitus, no external lesions, no vaginal discharge, mucosa pink and moist, no vaginal or cervical lesions, no vaginal atrophy, no friaility or hemorrhage, and no adnexal masses or tenderness.  uterus is enl on palp- more so on L no tenderness Msk:     No deformity or scoliosis noted of thoracic or lumbar spine.  no acute joint changes Pulses:  R and L carotid,radial,femoral,dorsalis pedis and posterior tibial pulses are full and equal bilaterally Extremities:     No clubbing, cyanosis, edema, or deformity noted with normal full range of motion of all joints.   Neurologic:     sensation intact to light touch, gait normal, and DTRs symmetrical and normal.   Skin:     some lentigos  Cervical Nodes:     No lymphadenopathy noted Axillary Nodes:     No palpable lymphadenopathy Inguinal Nodes:     No significant adenopathy Psych:     nl affect, cheerful    Impression & Recommendations:  Problem # 1:  HEALTH MAINTENANCE EXAM (ICD-V70.0) reviewed health habits including diet, exercise and skin cancer prevention reviewed health maintenance list and family history important to start scheduling exercise will check wellness labs Orders: Venipuncture (64403) TLB-BMP (Basic Metabolic Panel-BMET) (80048-METABOL) TLB-CBC Platelet - w/Differential (85025-CBCD) TLB-Hepatic/Liver Function Pnl (80076-HEPATIC) TLB-TSH (Thyroid Stimulating Hormone) (84443-TSH) TLB-Lipid Panel  (80061-LIPID)   Problem # 2:  ROUTINE GYNECOLOGICAL EXAMINATION (ICD-V72.31) exam done uterine enl palp- strongly suspect fibroids menses are not too bothersome will check pelvic US to rule out anything else sched mam as well post menup Orders: Pap Smear (47425) Mammogram (Screening) (Mammo)   Problem # 3:  UTERINE ENLARGEMENT (ICD-621.2) will do pelvic US to confirm suspect fibroids Orders: Ultrasound (Ultrasound)   Complete Medication List: 1)  Zyrtec Allergy 10 Mg Tabs (Cetirizine hcl) .... One by mouth qd 2)  Multi-vitamin Tabs (Multiple vitamin) .... Take by mouth as directed  Other Orders: Flu Vaccine 93yrs + (95638) Admin 1st Vaccine (75643)   Patient Instructions: 1)  please do stool cards after your birthday 2)  we will schedule your mammogram at check out 3)  try to get on a healthy exercise schedule 4)  we will schedule a pelvic ultrasound at check out    ]  Preventive Care Screening  Last Tetanus Booster:    Date:  05/19/2006    Results:  given    Influenza Vaccine    Vaccine Type: Fluvax 3+    Site: left deltoid    Mfr: Sanofi Pasteur    Dose: 0.5 ml    Route: IM    Given by: Lowella Petties    Exp. Date: 03/08/2008    Lot #: [id number]  Flu Vaccine Consent Questions    Do you have a history of severe allergic reactions to this vaccine? no    Any prior history of allergic reactions to egg and/or gelatin? no    Do you have a sensitivity to the preservative Thimersol? no    Do you have a past history of Guillan-Barre Syndrome? no    Do you currently have an acute febrile illness? no    Have you ever had a severe reaction to latex? no    Vaccine information given and explained to patient? yes    Are you currently pregnant? no

## 2010-10-09 NOTE — Assessment & Plan Note (Signed)
Summary: CONGESTION,COUGH/CLE   Vital Signs:  Patient Profile:   54 Years Old Female Height:     61 inches Weight:      197 pounds Temp:     97.4 degrees F oral Pulse rate:   72 / minute Pulse rhythm:   regular BP sitting:   124 / 80  (left arm) Cuff size:   regular  Vitals Entered By: Liane Comber (August 12, 2008 3:22 PM)                 Chief Complaint:  congestion.  History of Present Illness: Here for URI--onset x 10d, now with cough at night--productive,, HA, no fever or chills --taking theraflu severe cough/cold --worked today    Current Allergies (reviewed today): No known allergies      Review of Systems      See HPI   Physical Exam  General:     alert, well-developed, well-nourished, and well-hydrated.   Ears:     R ear normal and L ear normal.   Nose:     no airflow obstruction and mucosal edema.  sinuses neg Mouth:     no exudates and pharyngeal erythema.   Lungs:     moist harsh cough, no crackles and no wheezes.   Cervical Nodes:     no anterior cervical adenopathy and no posterior cervical adenopathy.   Psych:     normally interactive.      Impression & Recommendations:  Problem # 1:  COUGH (ICD-786.2) Assessment: New post viral cough will use hycodan as needed cough at night and delsym in daytime continue comfort care measures: increase po fluids, rest, tylenol or IBP as needed see back if not improved in 5-7d  Complete Medication List: 1)  Multi-vitamin Tabs (Multiple vitamin) .... Take by mouth as directed 2)  Zyrtec Allergy 10 Mg Tabs (Cetirizine hcl) .... Take 1 tablet by mouth once a day 3)  Hycodan  .Marland Kitchen.. 1 tsp at bedtime as needed cough, may repeat in 4-6h as needed by mouth    Prescriptions: HYCODAN 1 tsp at bedtime as needed cough, may repeat in 4-6h as needed by mouth  #136ml x 0   Entered and Authorized by:   Gildardo Griffes FNP   Signed by:   Gildardo Griffes FNP on 08/12/2008   Method  used:   Print then Give to Patient   RxID:   [id number]  ]

## 2010-10-09 NOTE — Assessment & Plan Note (Signed)
Summary: CPX/CLE   Vital Signs:  Patient profile:   55 year old female Height:      60.5 inches Weight:      197.50 pounds BMI:     38.07 Temp:     98 degrees F oral Pulse rate:   76 / minute Pulse rhythm:   regular BP sitting:   106 / 72  (left arm) Cuff size:   large  Vitals Entered By: Lewanda Rife LPN (October 25, 2009 9:45 AM)  History of Present Illness: here for health mt exam with pap and gyn and to rev chronic med problems   feeling well and no complaintes  wt is down 2 lb with bmi 35 going to [employer] 3 days per week- likes the convenience    bp good - no problems with that at all   hx of fibroids -- very heavy periods / not painful  colp with leep in past  nl pap 1/10 still not done with periods - now cycle is fluctuating a little  no hot flashes   mam 11/10 self exam - no lumps or changes   Td 07  flu --got that in november   has never had colonosc - wants to set that up   Allergies (verified): No Known Drug Allergies  Past History:  Past Medical History: Last updated: 10/07/2008 Allergic rhinitis Asthma- as a child uterine fibroids ovarian cyst   Past Surgical History: Last updated: 11/30/2007 Colposcopy,  LEEP (01/2005) Baker's cyst (03/2007) 12/08 pelvic US uterine fibroids and small ovarian cyst on R (rec re check cyst in 3 mo) 3/09 pelvic US uterine fibroids (with resolution of ovarian cyst)  Family History: Last updated: 04/23/2007 Father:  Mother: past heart problems, HTN Siblings: sister deceased- cervical cancer age 49  Social History: Last updated: 10/07/2008 Marital Status: Married Children: 2 Occupation:  nonsmoker non drinker  Risk Factors: Smoking Status: quit (04/23/2007)  Review of Systems General:  Denies fatigue, fever, loss of appetite, and malaise. Eyes:  Denies blurring and eye pain. CV:  Denies chest pain or discomfort, lightheadness, and palpitations. Resp:  Denies cough and wheezing. GI:  Denies  abdominal pain, bloody stools, change in bowel habits, indigestion, and nausea. GU:  Denies abnormal vaginal bleeding, decreased libido, discharge, hematuria, and urinary frequency. MS:  Denies joint pain and muscle aches. Derm:  Denies itching, lesion(s), poor wound healing, and rash. Neuro:  Denies numbness and tingling. Psych:  mood is ok . Endo:  Denies cold intolerance, excessive thirst, excessive urination, and heat intolerance. Heme:  Denies abnormal bruising and bleeding.  Physical Exam  General:  Well-developed,well-nourished,in no acute distress; alert,appropriate and cooperative throughout examination Head:  normocephalic, atraumatic, and no abnormalities observed.   Eyes:  vision grossly intact, pupils equal, pupils round, and pupils reactive to light.   Ears:  R ear normal and L ear normal.   Nose:  no nasal discharge.   Mouth:  pharynx pink and moist.   Neck:  supple with full rom and no masses or thyromegally, no JVD or carotid bruit  Chest Wall:  No deformities, masses, or tenderness noted. Breasts:  No mass, nodules, thickening, tenderness, bulging, retraction, inflamation, nipple discharge or skin changes noted.   Lungs:  Normal respiratory effort, chest expands symmetrically. Lungs are clear to auscultation, no crackles or wheezes. Heart:  Normal rate and regular rhythm. S1 and S2 normal without gallop, murmur, click, rub or other extra sounds. Abdomen:  Bowel sounds positive,abdomen soft and non-tender without  masses, organomegaly or hernias noted. uterus is palpable- baseline Genitalia:  Normal introitus for age, no external lesions, no vaginal discharge, mucosa pink and moist, no vaginal or cervical lesions, no vaginal atrophy, no friaility or hemorrhage, uterus is enlarged with fibroids  , no adnexal masses or tenderness Msk:  No deformity or scoliosis noted of thoracic or lumbar spine.  no acute joint changes  Pulses:  R and L carotid,radial,femoral,dorsalis pedis  and posterior tibial pulses are full and equal bilaterally Extremities:  No clubbing, cyanosis, edema, or deformity noted with normal full range of motion of all joints.   Neurologic:  sensation intact to light touch, gait normal, and DTRs symmetrical and normal.   Skin:  Intact without suspicious lesions or rashes Cervical Nodes:  No lymphadenopathy noted Axillary Nodes:  No palpable lymphadenopathy Inguinal Nodes:  No significant adenopathy Psych:  normal affect, talkative and pleasant    Impression & Recommendations:  Problem # 1:  HEALTH MAINTENANCE EXAM (ICD-V70.0) Assessment Comment Only reviewed health habits including diet, exercise and skin cancer prevention reviewed health maintenance list and family history  commended on good exercise labs today Orders: Venipuncture (91478) TLB-Lipid Panel (80061-LIPID) TLB-BMP (Basic Metabolic Panel-BMET) (80048-METABOL) TLB-CBC Platelet - w/Differential (85025-CBCD) TLB-Hepatic/Liver Function Pnl (80076-HEPATIC) TLB-TSH (Thyroid Stimulating Hormone) (84443-TSH)  Problem # 2:  SCREENING FOR MALIGNANT NEOPLASM OF THE RECTUM (ICD-V76.41) Assessment: Comment Only  Orderref for screening colonoscopys: Gastroenterology Referral (GI)  Problem # 3:  LEIOMYOMA OF UTERUS, 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 Take 1 tablet by mouth once a day  Orders: Prescription Created Electronically [phone])  Complete Medication List: 1)  Multi-vitamin Tabs (Multiple vitamin) .... Take by mouth as directed 2)  Zyrtec Allergy 10 Mg Tabs (Cetirizine hcl) .... Take 1 tablet by mouth once a day 3)  Singulair 10 Mg Tabs (Montelukast  sodium) .... Take 1 tablet by mouth once a day 4)  Vitamin D 400 Unit Tabs (Cholecalciferol) .... One daily 5)  Alka-seltzer Plus Cold 2-7.8-325 Mg Tbef (Chlorphen-phenyleph-asa) .... Otc as directed. 6)  Robitussin Dm 100-10 Mg/49ml Syrp (Dextromethorphan-guaifenesin) .... Otc as directed. 7)  Calcium Citrate Powd (Calcium citrate tetrahydrate) .... Takes one daily  Patient Instructions: 1)  keep working hard on healthy diet and exercise  2)  labs today 3)  we will set up colonocsopy at check out   Current Allergies (reviewed today): No known allergies      Influenza Immunization History:    Influenza # 1:  Fluvax 3+ (07/10/2009)

## 2010-10-09 NOTE — Progress Notes (Signed)
  Phone Note Call from Patient Call back at [facility] Phone [phone]   Caller: Patient Call For: Gildardo Griffes FNP Summary of Call: Needs Rx for Singulair [facility] to [facility] [place].  This was given to her earlier in the year and she has misplaced the Rx. Initial call taken by: Delilah Shan CMA (AAMA),  May 12, 2009 8:40 AM  Follow-up for Phone Call        prescription called in and patient notified  Follow-up by: Providence Crosby LPN,  May 12, 2009 9:35 AM    Prescriptions: SINGULAIR 10 MG TABS (MONTELUKAST SODIUM) Take 1 tablet by mouth once a day  #30 x 11   Entered by:   Providence Crosby LPN   Authorized by:   Judith Part MD   Signed by:   Providence Crosby LPN on 82/95/6213   Method used:   [facility]ically [facility]        [facility]  [place] (retail)       [address]       [place], [place]  [address]       Ph: [phone] or [phone]       Fax: [phone]   RxID:   [id number]

## 2010-10-09 NOTE — Procedures (Signed)
Summary: Colonoscopy  Patient: Isabella Ramirez Note: All result statuses are Final unless otherwise noted.  Tests: (1) Colonoscopy (COL)   COL Colonoscopy           DONE     [facility]     [address]     [place], [place]  [address]           COLONOSCOPY PROCEDURE REPORT           PATIENT:  Isabella, Ramirez  MR#:  [id number]     BIRTHDATE:  July 10, 1956, 54 yrs. old  GENDER:  female     ENDOSCOPIST:  Barbette Hair. Arlyce Dice, MD     REF. BY:     PROCEDURE DATE:  12/01/2009     PROCEDURE:  Colonoscopy, Diagnostic     ASA CLASS:  Class I     INDICATIONS:  Routine Risk Screening     MEDICATIONS:   Fentanyl 75 mcg IV, Versed 7 mg IV           DESCRIPTION OF PROCEDURE:   After the risks benefits and     alternatives of the procedure were thoroughly explained, informed     consent was obtained.  Digital rectal exam was performed and     revealed no abnormalities.   The LB [id number] endoscope     was introduced through the anus and advanced to the cecum, which     was identified by both the appendix and ileocecal valve, without     limitations.  The quality of the prep was excellent, using     MoviPrep.  The instrument was then slowly withdrawn as the colon     was fully examined.     <<PROCEDUREIMAGES>>           FINDINGS:  A normal appearing cecum, ileocecal valve, and     appendiceal orifice were identified. The ascending, hepatic     flexure, transverse, splenic flexure, descending, sigmoid colon,     and rectum appeared unremarkable (see image1, image2, image3,     image5, image6, image7, image10, image13, and image14).     Retroflexed views in the rectum revealed no abnormalities.    The     scope was then withdrawn from the patient and the procedure     completed.           COMPLICATIONS:  None     ENDOSCOPIC IMPRESSION:     1) Normal colon     RECOMMENDATIONS:     1) Continue current colorectal screening recommendations for     "routine risk" patients with a  repeat colonoscopy in 10 years.     REPEAT EXAM:  In 10 year(s) for Colonoscopy.           ______________________________     Barbette Hair. Arlyce Dice, MD           CC:  Isabella Pimple, MD           n.     Isabella DoctorBarbette Hair. Kaplan at 12/01/2009 10:13 AM           Isabella Ramirez, [id number]  Note: An exclamation mark (!) indicates a result that was not dispersed into the flowsheet. Document Creation Date: 12/01/2009 12:51 PM _______________________________________________________________________  (1) Order result status: Final Collection or observation date-time: 12/01/2009 09:59 Requested date-time:  Receipt date-time:  Reported date-time:  Referring Physician:   Ordering Physician: Melvia Heaps [id number]) Specimen Source:  Source: Isabella Ramirez Order Number:  [id number] Lab site:   Appended Document: Colonoscopy    Clinical Lists Changes  Observations: Added new observation of COLONNXTDUE: 11/2019 (12/01/2009 12:54)

## 2010-10-09 NOTE — Progress Notes (Signed)
  Phone Note Call from Patient Call back at [phone]   Caller: Patient Details for Reason: NEEDS FU PELVIC US Summary of Call: Pt calling to get rescheduled for her fu pelvic US that she was told to call us back to have done at [facility]. She would like to get it done on 11/27/07 from  2;00 to 4;00pm. Call pt back on her cell and leave her a voice mail. Please order pelvic US  and then Shirlee Limerick will call to set up. Initial call taken by: Carlton Adam,  November 17, 2007 3:01 PM  Follow-up for Phone Call        I will order follow up pelvic ultrasound to follow fibroids and ovarian cyst and send to [facility] with phone note  Follow-up by: Judith Part MD,  November 17, 2007 4:18 PM  Additional Follow-up for Phone Call Additional follow up Details #1::        PELVIC US SET UP FOR [facility] ON 11/27/07 AT 2:00. ORDER FAXED TO [facility].  Additional Follow-up by: Carlton Adam,  November 18, 2007 9:48 AM

## 2010-10-09 NOTE — Assessment & Plan Note (Signed)
Summary: 1 DAY F/U  DLO   Vital Signs:  Patient Profile:   55 Years Old Female Height:     61 inches Weight:      190 pounds Temp:     98.8 degrees F oral Pulse rate:   103 / minute BP sitting:   115 / 78  (right arm) Cuff size:   regular  Vitals Entered By: Cooper Render (February 16, 2008 8:44 AM)                 Chief Complaint:  1 day f/u.  History of Present Illness: Here for follow up of difficulty breathing --was sen yesterday and given steriod IM and to have started on prednisone dose pack this morning.  Less problems breathing today, feels edgy--started Advair last evening.  Coughing some, nasal congestion--yellow mucus.  No fever or chills.  Had gone to [place] over the weekend, had done [employer]--3 miles, did not rush.  began to have cough on trip home.      Current Allergies (reviewed today): No known allergies      Review of Systems      See HPI   Physical Exam  General:     alert, well-developed, well-nourished, and well-hydrated.   Lungs:     moist harsh cough, sscattered wheeze that moves with cough.  no resp distress. Psych:     normally interactive and good eye contact.      Impression & Recommendations:  Problem # 1:  ASTHMA (ICD-493.90) Assessment: Improved acute flair of asthma triggered probably being outdoors on 02/13/08 will start Singulair 4 mg --take 2 --samples given continue prednisone taper and zertec and Advair two times a day  will keep out of work for another day. see back in 1 wk or sooner if not improved Her updated medication list for this problem includes:    Prednisone (pak) 10 Mg Tabs (Prednisone) ..... Use as directed x 6d    Advair Diskus 100-50 Mcg/dose Misc (Fluticasone-salmeterol) .Marland Kitchen... 1 puff two times a day   Complete Medication List: 1)  Multi-vitamin Tabs (Multiple vitamin) .... Take by mouth as directed 2)  Prednisone (pak) 10 Mg Tabs (Prednisone) .... Use as directed x 6d 3)  Advair Diskus 100-50  Mcg/dose Misc (Fluticasone-salmeterol) .Marland Kitchen.. 1 puff two times a day 4)  Zyrtec Allergy 10 Mg Tabs (Cetirizine hcl) .... Take 1 tablet by mouth once a day   Patient Instructions: 1)  Please schedule a follow-up appointment in 1 weeks.   ] Prior Medications (reviewed today): MULTI-VITAMIN   TABS (MULTIPLE VITAMIN) take by mouth as directed PREDNISONE (PAK) 10 MG  TABS (PREDNISONE) use as directed x 6d ADVAIR DISKUS 100-50 MCG/DOSE  MISC (FLUTICASONE-SALMETEROL) 1 puff two times a day ZYRTEC ALLERGY 10 MG  TABS (CETIRIZINE HCL) Take 1 tablet by mouth once a day Current Allergies (reviewed today): No known allergies

## 2010-10-09 NOTE — Miscellaneous (Signed)
Summary: previsit/rm  Clinical Lists Changes  Medications: Added new medication of MOVIPREP 100 GM  SOLR (PEG-KCL-NACL-NASULF-NA ASC-C) As per prep instructions. - Signed Rx of MOVIPREP 100 GM  SOLR (PEG-KCL-NACL-NASULF-NA ASC-C) As per prep instructions.;  #1 x 0;  Signed;  Entered by: Sherren Kerns RN;  Authorized by: Louis Meckel MD;  Method used: [facility]  [address] [place], [place]  [address], Ph: [phone] or [phone], Fax: [phone] Observations: Added new observation of ALLERGY REV: Done (11/17/2009 13:42)    Prescriptions: MOVIPREP 100 GM  SOLR (PEG-KCL-NACL-NASULF-NA ASC-C) As per prep instructions.  #1 x 0   Entered by:   Sherren Kerns RN   Authorized by:   Louis Meckel MD   Signed by:   Sherren Kerns RN on 11/17/2009   Method used:   Electronically to        [facility]  [address] (retail)       [address]       [place], [place]  [address]       Ph: [phone] or [phone]       Fax: [phone]   RxID:   [id number]

## 2010-10-09 NOTE — Assessment & Plan Note (Signed)
Summary: 1 WEEK FOLLOW UP/[facility]   Vital Signs:  Patient Profile:   55 Years Old Female Height:     61 inches Weight:      191 pounds Temp:     98.3 degrees F oral Pulse rate:   82 / minute BP sitting:   112 / 89  (right arm) Cuff size:   regular  Vitals Entered By: Cooper Render (February 25, 2008 4:17 PM)                 Chief Complaint:  1 wk f/u and still has cough.  History of Present Illness: Here for follow up of recent bronchitis with wheezing--02/15/08.  Started on prednisone dose pack x6d and Advair disc 100/50 bid  which she continues to this point.  Singulair was add 6/9 in follow up visit---helps.  Also continues on Zyrtec once daily.  Continues to have cough productive of clear thin mucus.  No fever or chills in 4-5d, feels almost back to base line.  Is a bit gittery, notes after Advair more than rest of the day.    Current Allergies: No known allergies      Review of Systems      See HPI   Physical Exam  General:     alert, well-developed, well-nourished, and well-hydrated.   Nose:     boggy and edematusno airflow obstruction.   Mouth:     pharynx pink and moist.   Lungs:     normal respiratory effort, no intercostal retractions, no accessory muscle use, normal breath sounds, no crackles, and no wheezes.  moist cough only Psych:     normally interactive and good eye contact.      Impression & Recommendations:  Problem # 1:  WHEEZING (ICD-786.07) Assessment: Improved will taper off Advair by eliminating morning dose first as she feels tighter in late afternoon and has had this for some time.  then sstop Advair in  ~5d see back if has recurrence of wheezing or chest tightness after off Advair  Problem # 2:  ALLERGIC RHINITIS (ICD-477.9) Assessment: Improved will continue her on Singulair due to improvement with combo Zyrtec see back prn Her updated medication list for this problem includes:    Zyrtec Allergy 10 Mg Tabs (Cetirizine hcl) ..... One by  mouth qd   Complete Medication List: 1)  Zyrtec Allergy 10 Mg Tabs (Cetirizine hcl) .... One by mouth qd 2)  Multi-vitamin Tabs (Multiple vitamin) .... Take by mouth as directed 3)  Prednisone (pak) 10 Mg Tabs (Prednisone) .... Use as directed x 6d 4)  Advair Diskus 100-50 Mcg/dose Misc (Fluticasone-salmeterol) .Marland Kitchen.. 1 puff two times a day    ] Prior Medications (reviewed today): ZYRTEC ALLERGY 10 MG  TABS (CETIRIZINE HCL) one by mouth qd MULTI-VITAMIN   TABS (MULTIPLE VITAMIN) take by mouth as directed PREDNISONE (PAK) 10 MG  TABS (PREDNISONE) use as directed x 6d ADVAIR DISKUS 100-50 MCG/DOSE  MISC (FLUTICASONE-SALMETEROL) 1 puff two times a day Current Allergies: No known allergies

## 2010-10-09 NOTE — Progress Notes (Signed)
Summary: Note for work  Phone Note Call from Patient Call back at [employer] [phone]   Caller: Patient Call For: Dr. Hetty Ely Summary of Call: Patient is request a note for work.  Was sick on 03/16 but couldn't get in to be seen until 03/17.  She didn't work on 03/19 because she was still feeling bad.  Please call when note ready for pick up.   Initial call taken by: Linde Gillis CMA Duncan Dull),  December 04, 2009 11:44 AM  Follow-up for Phone Call        Done 3/16-3/20. Follow-up by: Shaune Leeks MD,  December 04, 2009 1:22 PM  Additional Follow-up for Phone Call Additional follow up Details #1::        Patient notified via cell phone voicemail, work note ready for pick up.  Will be left at front desk. Additional Follow-up by: Linde Gillis CMA Duncan Dull),  December 04, 2009 1:26 PM

## 2010-10-09 NOTE — Letter (Signed)
Summary: Results Follow up Letter  [place] [facility]  [address] [place], [place] [address]   Phone: [phone]  Fax: [phone]    08/02/2009 MRN: [id number]    Isabella Ramirez [address] [place], [place]  [address]    Dear Ms. Tsuda,  The following are the results of your recent test(s):  Test         Result    Pap Smear:        Normal _____  Not Normal _____ Comments: ______________________________________________________ Cholesterol: LDL(Bad cholesterol):         Your goal is less than:         HDL (Good cholesterol):       Your goal is more than: Comments:  ______________________________________________________ Mammogram:        Normal ___X__  Not Normal _____ Comments:  Yearly follow up is recommended.   ___________________________________________________________________ Hemoccult:        Normal _____  Not normal _______ Comments:    _____________________________________________________________________ Other Tests:    We routinely do not discuss normal results over the telephone.  If you desire a copy of the results, or you have any questions about this information we can discuss them at your next office visit.   Sincerely,    Marne A. Milinda Antis, M.D.  MAT:lsf

## 2010-10-09 NOTE — Miscellaneous (Signed)
Summary: pap results  Clinical Lists Changes  Observations: Added new observation of PAP SMEAR: normal (08/10/2007 12:22)       Preventive Care Screening  Pap Smear:    Date:  08/10/2007    Results:  normal

## 2010-10-09 NOTE — Assessment & Plan Note (Signed)
Summary: COLD/MK   Vital Signs:  Patient profile:   54 year old female Height:      60.5 inches Weight:      199.50 pounds BMI:     38.46 Temp:     98.6 degrees F oral Pulse rate:   76 / minute Pulse rhythm:   regular Resp:     20 per minute BP sitting:   116 / 70  (left arm) Cuff size:   large  Vitals Entered By: Lewanda Rife LPN (July 21, 2009 11:35 AM)  CC:  productive cough with clear mucus and drainage at back of throat with scratchy throat.  History of Present Illness: Here for URI signs, cough --onset x 3wks, no fever or chills --cough keeps awake, in late afternoon--no wheezing or chest tightnes --taking robitussin plain and DM, nyquil, alka seltzer cold--minimal help  Allergies (verified): No Known Drug Allergies  Review of Systems      See HPI  Physical Exam  General:  alert, well-developed, well-nourished, well-hydrated, and overweight-appearing.  NAD Ears:  TMs retracted with fluid bilateras Nose:  no mucosal edema, no airflow obstruction, and mucosal erythema.  sinuses neg Mouth:  no exudates and pharyngeal erythema.   Lungs:  moist harsh cough, no crackles and no wheezes.   Neurologic:  alert & oriented X3 and gait normal.   Cervical Nodes:  no anterior cervical adenopathy and no posterior cervical adenopathy.   Psych:  normally interactive and good eye contact.     Impression & Recommendations:  Problem # 1:  URI (ICD-465.9) Assessment New continue comfort care measures: increase po fluids, rest, tylenol or IBP as needed will start on z pac will use Delsym at hs see back in 7-10d if not improved Her updated medication list for this problem includes:    Zyrtec Allergy 10 Mg Tabs (Cetirizine hcl) .Marland Kitchen... Take 1 tablet by mouth once a day    Alka-seltzer Plus Cold 2-7.8-325 Mg Tbef (Chlorphen-phenyleph-asa) ..... Otc as directed.    Robitussin Dm 100-10 Mg/59ml Syrp (Dextromethorphan-guaifenesin) ..... Otc as directed.  Complete Medication List: 1)   Multi-vitamin Tabs (Multiple vitamin) .... Take by mouth as directed 2)  Zyrtec Allergy 10 Mg Tabs (Cetirizine hcl) .... Take 1 tablet by mouth once a day 3)  Singulair 10 Mg Tabs (Montelukast sodium) .... Take 1 tablet by mouth once a day 4)  Vitamin D 400 Unit Tabs (Cholecalciferol) .... One daily 5)  Alka-seltzer Plus Cold 2-7.8-325 Mg Tbef (Chlorphen-phenyleph-asa) .... Otc as directed. 6)  Robitussin Dm 100-10 Mg/28ml Syrp (Dextromethorphan-guaifenesin) .... Otc as directed. 7)  Zithromax Z-pak 250 Mg Tabs (Azithromycin) .... Use as directed Prescriptions: ZITHROMAX Z-PAK 250 MG TABS (AZITHROMYCIN) use as directed  #1 x 0   Entered and Authorized by:   Gildardo Griffes FNP   Signed by:   Gildardo Griffes FNP on 07/21/2009   Method used:   [facility]        [facility]  [address] (retail)       [address]       [place], [place]  [address]       Ph: [phone] or [phone]       Fax: [phone]   RxID:   [id number]   Current Allergies (reviewed today): No known allergies

## 2010-10-09 NOTE — Miscellaneous (Signed)
Summary: mammo results  Clinical Lists Changes  Observations: Added new observation of MAMMO DUE: 11/2008 (11/02/2007 8:39) Added new observation of MAMMOGRAM: normal (11/02/2007 8:39)       Preventive Care Screening  Mammogram:    Date:  11/02/2007    Next Due:  11/2008    Results:  normal

## 2010-10-09 NOTE — Letter (Signed)
Summary: [facility] Instructions  [facility]  [address] [place], [place] [address]   Phone: [phone]  Fax: [phone]       Isabella Ramirez    Dec 17, 1955    MRN: [id number]        Procedure Day Dorna Bloom:  Farrell Ours  12/01/09     Arrival Time:  8:30AM      Procedure Time:  9:30AM     Location of Procedure:                    X  [facility] (4th Floor)                         PREPARATION FOR COLONOSCOPY WITH MOVIPREP   Starting 5 days prior to your procedure 11/26/09 do not eat nuts, seeds, popcorn, corn, beans, peas,  salads, or any raw vegetables.  Do not take any fiber supplements (e.g. Metamucil, Citrucel, and Benefiber).  THE DAY BEFORE YOUR PROCEDURE         DATE: 11/30/09  DAY: THURSDAY  1.  Drink clear liquids the entire day-NO SOLID FOOD  2.  Do not drink anything colored red or purple.  Avoid juices with pulp.  No orange juice.  3.  Drink at least 64 oz. (8 glasses) of fluid/clear liquids during the day to prevent dehydration and help the prep work efficiently.  CLEAR LIQUIDS INCLUDE: Water Jello Ice Popsicles Tea (sugar ok, no milk/cream) Powdered fruit flavored drinks Coffee (sugar ok, no milk/cream) Gatorade Juice: apple, white grape, white cranberry  Lemonade Clear bullion, consomm, broth Carbonated beverages (any kind) Strained chicken noodle soup Hard Candy                             4.  In the morning, mix first dose of MoviPrep solution:    Empty 1 Pouch A and 1 Pouch B into the disposable container    Add lukewarm drinking water to the top line of the container. Mix to dissolve    Refrigerate (mixed solution should be used within 24 hrs)  5.  Begin drinking the prep at 5:00 p.m. The MoviPrep container is divided by 4 marks.   Every 15 minutes drink the solution down to the next mark (approximately 8 oz) until the full liter is complete.   6.  Follow completed prep with 16 oz of clear liquid of your choice  (Nothing red or purple).  Continue to drink clear liquids until bedtime.  7.  Before going to bed, mix second dose of MoviPrep solution:    Empty 1 Pouch A and 1 Pouch B into the disposable container    Add lukewarm drinking water to the top line of the container. Mix to dissolve    Refrigerate  THE DAY OF YOUR PROCEDURE      DATE: 12/01/09  DAY: FRIDAY  Beginning at 4:30AM (5 hours before procedure):         1. Every 15 minutes, drink the solution down to the next mark (approx 8 oz) until the full liter is complete.  2. Follow completed prep with 16 oz. of clear liquid of your choice.    3. You may drink clear liquids until 7:30AM (2 HOURS BEFORE PROCEDURE).   MEDICATION INSTRUCTIONS  Unless otherwise instructed, you should take regular prescription medications with a small sip of water   as early as possible the  morning of your procedure.    Additional medication instructions: n/a        OTHER INSTRUCTIONS  You will need a responsible adult at least 53 years of age to accompany you and drive you home.   This person must remain in the waiting room during your procedure.  Wear loose fitting clothing that is easily removed.  Leave jewelry and other valuables at home.  However, you may wish to bring a book to read or  an iPod/MP3 player to listen to music as you wait for your procedure to start.  Remove all body piercing jewelry and leave at home.  Total time from sign-in until discharge is approximately 2-3 hours.  You should go home directly after your procedure and rest.  You can resume normal activities the  day after your procedure.  The day of your procedure you should not:   Drive   Make legal decisions   Operate machinery   Drink alcohol   Return to work  You will receive specific instructions about eating, activities and medications before you leave.    The above instructions have been reviewed and explained to me by   Sherren Kerns RN  November 17, 2009 2:01 PM     I fully understand and can verbalize these instructions _____________________________ Date _________

## 2011-02-04 ENCOUNTER — Encounter: Payer: Self-pay | Admitting: Family Medicine

## 2011-02-15 ENCOUNTER — Other Ambulatory Visit (HOSPITAL_COMMUNITY)
Admission: RE | Admit: 2011-02-15 | Discharge: 2011-02-15 | Disposition: A | Discharge status: Home / Self Care | Payer: 59 | Source: Ambulatory Visit | Attending: Family Medicine | Admitting: Family Medicine

## 2011-02-15 ENCOUNTER — Encounter: Payer: Self-pay | Admitting: Family Medicine

## 2011-02-15 ENCOUNTER — Ambulatory Visit (INDEPENDENT_AMBULATORY_CARE_PROVIDER_SITE_OTHER): Payer: 59 | Admitting: Family Medicine

## 2011-02-15 DIAGNOSIS — Z01419 Encounter for gynecological examination (general) (routine) without abnormal findings: Secondary | ICD-10-CM | POA: Insufficient documentation

## 2011-02-15 DIAGNOSIS — Z0000 Encounter for general adult medical examination without abnormal findings: Secondary | ICD-10-CM

## 2011-02-15 DIAGNOSIS — J309 Allergic rhinitis, unspecified: Secondary | ICD-10-CM

## 2011-02-15 DIAGNOSIS — Z1231 Encounter for screening mammogram for malignant neoplasm of breast: Secondary | ICD-10-CM | POA: Insufficient documentation

## 2011-02-15 LAB — CBC WITH DIFFERENTIAL/PLATELET
Basophils Absolute: 0 10*3/uL (ref 0.0–0.1)
Basophils Relative: 0.7 % (ref 0.0–3.0)
Eosinophils Absolute: 0.2 10*3/uL (ref 0.0–0.7)
Eosinophils Relative: 5.6 % — ABNORMAL HIGH (ref 0.0–5.0)
HCT: 39.5 % (ref 36.0–46.0)
Hemoglobin: 13.4 g/dL (ref 12.0–15.0)
Lymphocytes Relative: 34.5 % (ref 12.0–46.0)
Lymphs Abs: 1.1 10*3/uL (ref 0.7–4.0)
MCHC: 33.9 g/dL (ref 30.0–36.0)
MCV: 90.9 fl (ref 78.0–100.0)
Monocytes Absolute: 0.2 10*3/uL (ref 0.1–1.0)
Monocytes Relative: 7.8 % (ref 3.0–12.0)
Neutro Abs: 1.6 10*3/uL (ref 1.4–7.7)
Neutrophils Relative %: 51.4 % (ref 43.0–77.0)
Platelets: 254 10*3/uL (ref 150.0–400.0)
RBC: 4.34 Mil/uL (ref 3.87–5.11)
RDW: 12.7 % (ref 11.5–14.6)
WBC: 3.1 10*3/uL — ABNORMAL LOW (ref 4.5–10.5)

## 2011-02-15 LAB — LIPID PANEL
Cholesterol: 184 mg/dL (ref 0–200)
HDL: 68.2 mg/dL (ref >39.00)
LDL Cholesterol: 107 mg/dL — ABNORMAL HIGH (ref 0–99)
Total CHOL/HDL Ratio: 3
Triglycerides: 43 mg/dL (ref 0.0–149.0)
VLDL: 8.6 mg/dL (ref 0.0–40.0)

## 2011-02-15 LAB — COMPREHENSIVE METABOLIC PANEL
ALT: 18 U/L (ref 0–35)
AST: 21 U/L (ref 0–37)
Albumin: 3.9 g/dL (ref 3.5–5.2)
Alkaline Phosphatase: 70 U/L (ref 39–117)
BUN: 8 mg/dL (ref 6–23)
CO2: 33 mEq/L — ABNORMAL HIGH (ref 19–32)
Calcium: 9.2 mg/dL (ref 8.4–10.5)
Chloride: 104 mEq/L (ref 96–112)
Creatinine, Ser: 0.7 mg/dL (ref 0.4–1.2)
GFR: 107.08 mL/min (ref >60.00)
Glucose, Bld: 84 mg/dL (ref 70–99)
Potassium: 4.1 mEq/L (ref 3.5–5.1)
Sodium: 142 mEq/L (ref 135–145)
Total Bilirubin: 0.4 mg/dL (ref 0.3–1.2)
Total Protein: 6.8 g/dL (ref 6.0–8.3)

## 2011-02-15 LAB — TSH: TSH: 1.86 u[IU]/mL (ref 0.35–5.50)

## 2011-02-15 MED ORDER — MONTELUKAST SODIUM 10 MG PO TABS: 10.0000 mg | ORAL_TABLET | Freq: Every day | ORAL | Status: DC

## 2011-02-15 NOTE — Patient Instructions (Signed)
Labs today We will schedule mammogram at check out  Keep working on healthy diet and exercise Try to get 1200-1500 mg of calcium per day with at least 1000 iu of vitamin D - for bone health

## 2011-02-15 NOTE — Progress Notes (Signed)
Subjective:    Patient ID: Isabella Ramirez, female    DOB: 07/02/1956, 55 y.o.   MRN: [id number]  HPI Here for health mt exam and to review chronic med problems   Has been doing very well - no complaints   Wt is up 5lb with bmi of 37  Mam 11/10 neg Missed her mam this year?  Self exam - no lumps or changes    Pap 2/11 nl  No gyn problems  leep in past Sister had cervical cancer No period since march -- getting very close --to menopause Not a lot of hot flashes  Has uterine fibroids - not really bothering her at all   Td9/07   colnosc 3/11 normal- recommended 10 y f/u   Allergies worse this year - sneezing/congestion  Patient Active Problem List  Diagnoses  . LEIOMYOMA OF UTERUS, UNSPECIFIED  . ALLERGIC RHINITIS  . ASTHMA  . UTERINE ENLARGEMENT  . Routine general medical examination at a health care facility  . Routine gynecological examination  . Other screening mammogram   Past Medical History  Diagnosis Date  . Allergy     allergic rhinitis  . Asthma     as a child  . Uterine fibroids affecting pregnancy   . Ovarian cyst    Past Surgical History  Procedure Date  . Leep 01/2005  . Popliteal synovial cyst excision    History  Substance Use Topics  . Smoking status: Never Smoker   . Smokeless tobacco: Not on file  . Alcohol Use: No   Family History  Problem Relation Age of Onset  . Heart disease Mother   . Hypertension Mother    No Known Allergies Current Outpatient Prescriptions on File Prior to Visit  Medication Sig Dispense Refill  . cetirizine (ZYRTEC) 10 MG tablet Take 10 mg by mouth daily.        . Multiple Vitamin (MULTIVITAMIN) capsule Take 1 capsule by mouth daily.        . vitamin E 400 UNIT capsule Take 400 Units by mouth daily.                Review of Systems  Review of Systems  Constitutional: Negative for fever, appetite change, fatigue and unexpected weight change.  ENT pos for sneezing/ itchy eyes   Respiratory: Negative  for cough and shortness of breath.   Cardiovascular: Negative.  for cp or sob  Gastrointestinal: Negative for nausea, diarrhea and constipation.  Genitourinary: Negative for urgency and frequency.  Skin: Negative for pallor.  Neurological: Negative for weakness, light-headedness, numbness and headaches.  Hematological: Negative for adenopathy. Does not bruise/bleed easily.  Psychiatric/Behavioral: Negative for dysphoric mood. The patient is not nervous/anxious.          Objective:   Physical Exam  Constitutional: She appears well-developed and well-nourished. No distress.       overwt and well appearing   HENT:  Head: Normocephalic and atraumatic.  Right Ear: External ear normal.  Left Ear: External ear normal.  Mouth/Throat: Oropharynx is clear and moist.       Nares are boggy and pale  Eyes: Conjunctivae and EOM are normal. Pupils are equal, round, and reactive to light.  Neck: Normal range of motion. Neck supple. No JVD present. Carotid bruit is not present. No thyromegaly present.  Cardiovascular: Normal rate, regular rhythm, normal heart sounds and intact distal pulses.   Pulmonary/Chest: Effort normal and breath sounds normal. No respiratory distress. She has no wheezes.  Abdominal: Soft.  Bowel sounds are normal. She exhibits no distension, no abdominal bruit and no mass. There is no tenderness.  Genitourinary: Vagina normal. No breast swelling, tenderness, discharge or bleeding. Uterus is enlarged. Uterus is not tender. No vaginal discharge found.  Musculoskeletal: Normal range of motion. She exhibits no edema and no tenderness.  Lymphadenopathy:    She has no cervical adenopathy.  Neurological: She is alert. She has normal reflexes. Coordination normal.  Skin: Skin is warm and dry. No rash noted. No erythema. No pallor.  Psychiatric: She has a normal mood and affect.          Assessment & Plan:

## 2011-02-15 NOTE — Assessment & Plan Note (Signed)
Annual mam sched Nl exam Enc self breast exams monthly

## 2011-02-15 NOTE — Assessment & Plan Note (Signed)
Reviewed health habits including diet and exercise and skin cancer prevention Also reviewed health mt list, fam hx and immunizations  Wellness labs today Disc exercise for wt loss

## 2011-02-15 NOTE — Assessment & Plan Note (Signed)
On singulaire because zyrtec alone does not control symptoms adequately Refilled med  Disc avoidance of allergens

## 2011-02-17 NOTE — Assessment & Plan Note (Signed)
Annual exam with pap Uterine enl is baseline  Almost menopausal  Pt does not want to tx fibroids at this time due to lack of symptoms  Continue to monitor

## 2011-02-18 ENCOUNTER — Ambulatory Visit
Admission: RE | Admit: 2011-02-18 | Discharge: 2011-02-18 | Disposition: A | Discharge status: Home / Self Care | Payer: 59 | Source: Ambulatory Visit | Attending: Family Medicine | Admitting: Family Medicine

## 2011-02-18 DIAGNOSIS — Z1231 Encounter for screening mammogram for malignant neoplasm of breast: Secondary | ICD-10-CM

## 2011-02-19 ENCOUNTER — Telehealth: Payer: Self-pay

## 2011-02-19 NOTE — Telephone Encounter (Signed)
Message copied by Patience Musca on Tue Feb 19, 2011  6:32 PM ------      Message from: Roxy Manns A      Created: Sun Feb 17, 2011  1:28 PM       Labs overall ok       Chol up a bit - but overall not bad- watch sat fats in diet       Wbc is slt low - but this is relatively stable (I think nl for pt)      Will continue to monitor

## 2011-02-19 NOTE — Telephone Encounter (Signed)
Patient notified as instructed by phone tree  

## 2011-03-01 ENCOUNTER — Encounter: Payer: Self-pay | Admitting: *Deleted

## 2012-02-26 ENCOUNTER — Encounter: Payer: Self-pay | Admitting: Family Medicine

## 2012-02-26 ENCOUNTER — Ambulatory Visit (INDEPENDENT_AMBULATORY_CARE_PROVIDER_SITE_OTHER): Payer: BC Managed Care – PPO | Admitting: Family Medicine

## 2012-02-26 VITALS — BP 120/72 | HR 90 | Temp 97.6°F | Ht 61.25 in | Wt 200.5 lb

## 2012-02-26 DIAGNOSIS — M722 Plantar fascial fibromatosis: Secondary | ICD-10-CM

## 2012-02-26 DIAGNOSIS — L821 Other seborrheic keratosis: Secondary | ICD-10-CM

## 2012-02-26 NOTE — Progress Notes (Signed)
Nature conservation officer at [facility] [address] [place] [address] Phone: [phone] Fax: [phone]   Patient Name: Isabella Ramirez Date of Birth: [birth year] Age: 56 y.o. Medical Record Number: [id number] Gender: female Date of Encounter: 02/26/2012  History of Present Illness:  Isabella Ramirez is a 56 y.o. very pleasant female patient who presents with the following:  Primary complaint is that of bilateral heel pain, RIGHT significantly worse than the LEFT it is been ongoing for about 6-12 months. It is bothering her intravenous amount particularly when she gets up in the morning, and also after she has been sitting for some time. She has had plantar fasciitis in the past, and she is been doing rehabilitation, massage, stretching and strengthening. She has been doing this for 6 months without any significant relief. The LEFT foot has gotten somewhat better. She also has changed her footwear some. It did help when she changed to some Clark's shoes. She also has insoles and heel pads  Seb K. There is a place on her for habits she is curious about it is been there for many years. It is slightly more irritated and raised.  B heel pain, R > L. Was doing a lot of standing. Limping a great deal. When progressing -- 6 months. Rolled a ball.   Past Medical History, Surgical History, Social History, Family History, Problem List, Medications, and Allergies have been reviewed and updated if relevant.  Prior to Admission medications   Medication Sig Start Date End Date Taking? Authorizing Provider  BIOTIN FORTE PO Take 1 tablet by mouth daily.     Yes Historical Provider, MD  calcium gluconate 500 MG tablet Take 500 mg by mouth daily.     Yes Historical Provider, MD  cetirizine (ZYRTEC) 10 MG tablet Take 10 mg by mouth daily.     Yes Historical Provider, MD  ferrous sulfate 325 (65 FE) MG tablet Take 325 mg by mouth daily with breakfast.     Yes Historical Provider, MD  montelukast  (SINGULAIR) 10 MG tablet Take 1 tablet (10 mg total) by mouth daily. 02/15/11  Yes Judy Pimple, MD  Multiple Vitamin (MULTIVITAMIN) capsule Take 1 capsule by mouth daily.     Yes Historical Provider, MD  vitamin E 400 UNIT capsule Take 400 Units by mouth daily.     Yes Historical Provider, MD    Review of Systems:  GEN: No fevers, chills. Nontoxic. Primarily MSK c/o today. MSK: Detailed in the HPI GI: tolerating PO intake without difficulty Neuro: No numbness, parasthesias, or tingling associated. Otherwise the pertinent positives of the ROS are noted above.    Physical Examination: Filed Vitals:   02/26/12 1542  BP: 120/72  Pulse: 90  Temp: 97.6 F (36.4 C)   Filed Vitals:   02/26/12 1542  Height: 5' 1.25" (1.556 m)  Weight: 200 lb 8 oz (90.946 kg)   Body mass index is 37.58 kg/(m^2). Ideal Body Weight: Weight in (lb) to have BMI = 25: 133.1    GEN: WDWN, NAD, Non-toxic, Alert & Oriented x 3 HEENT: Atraumatic, Normocephalic.  Ears and Nose: No external deformity. EXTR: No clubbing/cyanosis/edema NEURO: Normal gait.  PSYCH: Normally interactive. Conversant. Not depressed or anxious appearing.  Calm demeanor.   Foot exam B Echymosis: no Edema: no ROM: full LE B Gait: heel toe, non-antalgic MT pain: no Callus pattern: none Lateral Mall: NT Medial Mall: NT Talus: NT Navicular: NT Calcaneous: NT Metatarsals: NT 5th MT: NT Phalanges: NT Achilles:  NT Plantar Fascia: tender, medial along PF. Pain with forced dorsi, more on the R Fat Pad: NT Peroneals: NT Post Tib: NT Great Toe: Nml motion Ant Drawer: neg Other foot breakdown: none Long arch: preserved - relatively neutral Transverse arch: preserved, with mild breakdown and mild bunion Hindfoot breakdown: none Sensation: intact    Assessment and Plan:  1. Plantar fascia syndrome   2. Seborrheic keratosis    I reassured her about her skin lesion, which appears to be a benign seborrheic keratosis. It is not  bothering her, so she wishes to do nothing about it.  Plantar Fascitis: >25 minutes spent in face to face time with patient, >50% spent in counselling or coordination of care  We reviewed that stretching is critically important to the treatment of PF. Reviewed footwear. Rigid soles have been shown to help with PF. Reviewed rehab of stretching and calf raises.  Could benefit from a corticosteroid injection, orthotics, or other measures if conservative treatment fails.   Also given an arch binder for use at work  Plantar Fascitis Injection, R Verbal consent obtained. Risks including risk of rupture, hypopigmentation, and infection reviewed in addition to benefits and alternatives were reviewed. Chloraprep used for prep. Ethyl Chloride used for anesthesia. Under sterile conditions, using the medial approach 2 cc of Lidocaine 1% and 3/4 cc of Depo-Medrol 40 mg injected superior to plantar fascia and adjacent to aponeurosis and fanned. No compications. Decreased pain post-injection.   Orders Today: No orders of the defined types were placed in this encounter.    Medications Today: No orders of the defined types were placed in this encounter.     Hannah Beat, MD

## 2012-02-26 NOTE — Patient Instructions (Addendum)
Excellent Over the Counter Orthotics: Hapad: available at www.hapad.com SPENCO: Available at some sports stores or www.amazon.com  SHOES: Danskos, Merrells, Keens, Sayner - good arch support, want minimal bendability [employer] in [place]: excellent staff, shoe selection, OTC orthotics Shoes: Birkenstock shoes, [employer] [place], [address] [place], [place]  Tuli - heel cups

## 2012-06-19 ENCOUNTER — Ambulatory Visit (INDEPENDENT_AMBULATORY_CARE_PROVIDER_SITE_OTHER): Payer: BC Managed Care – PPO | Admitting: Family Medicine

## 2012-06-19 ENCOUNTER — Other Ambulatory Visit (HOSPITAL_COMMUNITY)
Admission: RE | Admit: 2012-06-19 | Discharge: 2012-06-19 | Disposition: A | Discharge status: Home / Self Care | Payer: BC Managed Care – PPO | Source: Ambulatory Visit | Attending: Family Medicine | Admitting: Family Medicine

## 2012-06-19 ENCOUNTER — Encounter: Payer: Self-pay | Admitting: Family Medicine

## 2012-06-19 VITALS — BP 124/76 | HR 80 | Temp 98.2°F | Ht 61.0 in | Wt 204.5 lb

## 2012-06-19 DIAGNOSIS — Z23 Encounter for immunization: Secondary | ICD-10-CM

## 2012-06-19 DIAGNOSIS — Z0000 Encounter for general adult medical examination without abnormal findings: Secondary | ICD-10-CM

## 2012-06-19 DIAGNOSIS — Z01419 Encounter for gynecological examination (general) (routine) without abnormal findings: Secondary | ICD-10-CM

## 2012-06-19 DIAGNOSIS — Z1151 Encounter for screening for human papillomavirus (HPV): Secondary | ICD-10-CM | POA: Insufficient documentation

## 2012-06-19 DIAGNOSIS — Z1231 Encounter for screening mammogram for malignant neoplasm of breast: Secondary | ICD-10-CM

## 2012-06-19 DIAGNOSIS — D259 Leiomyoma of uterus, unspecified: Secondary | ICD-10-CM

## 2012-06-19 LAB — COMPREHENSIVE METABOLIC PANEL
ALT: 18 U/L (ref 0–35)
AST: 17 U/L (ref 0–37)
Albumin: 3.4 g/dL — ABNORMAL LOW (ref 3.5–5.2)
Alkaline Phosphatase: 64 U/L (ref 39–117)
BUN: 8 mg/dL (ref 6–23)
CO2: 28 mEq/L (ref 19–32)
Calcium: 8.5 mg/dL (ref 8.4–10.5)
Chloride: 103 mEq/L (ref 96–112)
Creatinine, Ser: 0.7 mg/dL (ref 0.4–1.2)
GFR: 121.81 mL/min (ref >60.00)
Glucose, Bld: 79 mg/dL (ref 70–99)
Potassium: 3.8 mEq/L (ref 3.5–5.1)
Sodium: 138 mEq/L (ref 135–145)
Total Bilirubin: 0.5 mg/dL (ref 0.3–1.2)
Total Protein: 6.6 g/dL (ref 6.0–8.3)

## 2012-06-19 LAB — LIPID PANEL
Cholesterol: 170 mg/dL (ref 0–200)
HDL: 59.5 mg/dL (ref >39.00)
LDL Cholesterol: 101 mg/dL — ABNORMAL HIGH (ref 0–99)
Total CHOL/HDL Ratio: 3
Triglycerides: 49 mg/dL (ref 0.0–149.0)
VLDL: 9.8 mg/dL (ref 0.0–40.0)

## 2012-06-19 LAB — CBC WITH DIFFERENTIAL/PLATELET
Basophils Absolute: 0 K/uL (ref 0.0–0.1)
Basophils Relative: 0.5 % (ref 0.0–3.0)
Eosinophils Absolute: 0.1 K/uL (ref 0.0–0.7)
Eosinophils Relative: 3 % (ref 0.0–5.0)
HCT: 39.8 % (ref 36.0–46.0)
Hemoglobin: 13.1 g/dL (ref 12.0–15.0)
Lymphocytes Relative: 28.3 % (ref 12.0–46.0)
Lymphs Abs: 1.2 K/uL (ref 0.7–4.0)
MCHC: 33 g/dL (ref 30.0–36.0)
MCV: 90.1 fl (ref 78.0–100.0)
Monocytes Absolute: 0.4 K/uL (ref 0.1–1.0)
Monocytes Relative: 9 % (ref 3.0–12.0)
Neutro Abs: 2.4 K/uL (ref 1.4–7.7)
Neutrophils Relative %: 59.2 % (ref 43.0–77.0)
Platelets: 251 K/uL (ref 150.0–400.0)
RBC: 4.42 Mil/uL (ref 3.87–5.11)
RDW: 13.2 % (ref 11.5–14.6)
WBC: 4.1 K/uL — ABNORMAL LOW (ref 4.5–10.5)

## 2012-06-19 LAB — TSH: TSH: 1.42 u[IU]/mL (ref 0.35–5.50)

## 2012-06-19 MED ORDER — MONTELUKAST SODIUM 10 MG PO TABS: 10.0000 mg | ORAL_TABLET | Freq: Every day | ORAL | Status: DC

## 2012-06-19 NOTE — Assessment & Plan Note (Signed)
Annual exam with pap  Fibroids noted Hx of LEEP remotely

## 2012-06-19 NOTE — Patient Instructions (Addendum)
Labs today  We will refer you for mammogram at check out  Flu shot today  Take care of yourself with healthy diet and exercise

## 2012-06-19 NOTE — Progress Notes (Signed)
Subjective:    Patient ID: Isabella Ramirez, female    DOB: 1956/07/12, 56 y.o.   MRN: [id number]  HPI Here for health maintenance exam and to review chronic medical problems   Doing well overall     Wt is up 4 lb withbmi of 38 Is upset about wt gain  Health habits - needs to start back with exercise / does different things   We need to do labs today   Lab Results  Component Value Date   CHOL 184 02/15/2011   HDL 68.20 02/15/2011   LDLCALC 107* 02/15/2011   TRIG 43.0 02/15/2011   CHOLHDL 3 02/15/2011     Needs labs today  Flu shot-let do that   mammo 6/12- needs to schedule that at the [facility]  Self exam-no lumps   Pap 6/12 Hx of a LEEP in past  Is not having periods at all - since April or may    colonosc 3/11 - came out well   Patient Active Problem List  Diagnosis  . LEIOMYOMA OF UTERUS, UNSPECIFIED  . ALLERGIC RHINITIS  . ASTHMA  . UTERINE ENLARGEMENT  . Routine general medical examination at a health care facility  . Routine gynecological examination  . Other screening mammogram   Past Medical History  Diagnosis Date  . Allergy     allergic rhinitis  . Asthma     as a child  . Uterine fibroids affecting pregnancy   . Ovarian cyst    Past Surgical History  Procedure Date  . Leep 01/2005  . Popliteal synovial cyst excision    History  Substance Use Topics  . Smoking status: Never Smoker   . Smokeless tobacco: Not on file  . Alcohol Use: No   Family History  Problem Relation Age of Onset  . Heart disease Mother   . Hypertension Mother    No Known Allergies Current Outpatient Prescriptions on File Prior to Visit  Medication Sig Dispense Refill  . BIOTIN FORTE PO Take 1 tablet by mouth daily.        . calcium gluconate 500 MG tablet Take 500 mg by mouth daily.        . cetirizine (ZYRTEC) 10 MG tablet Take 10 mg by mouth daily.        . ferrous sulfate 325 (65 FE) MG tablet Take 325 mg by mouth daily with breakfast.        . Multiple Vitamin  (MULTIVITAMIN) capsule Take 1 capsule by mouth daily.        . vitamin E 400 UNIT capsule Take 400 Units by mouth daily.        . montelukast (SINGULAIR) 10 MG tablet Take 1 tablet (10 mg total) by mouth daily.  30 tablet  11        Review of Systems Review of Systems  Constitutional: Negative for fever, appetite change, fatigue and unexpected weight change.  Eyes: Negative for pain and visual disturbance.  Respiratory: Negative for cough and shortness of breath.   Cardiovascular: Negative for cp or palpitations    Gastrointestinal: Negative for nausea, diarrhea and constipation.  Genitourinary: Negative for urgency and frequency.  Skin: Negative for pallor or rash   Neurological: Negative for weakness, light-headedness, numbness and headaches.  Hematological: Negative for adenopathy. Does not bruise/bleed easily.  Psychiatric/Behavioral: Negative for dysphoric mood. The patient is not nervous/anxious.         Objective:   Physical Exam  Constitutional: She appears well-developed and well-nourished. No  distress.       obese and well appearing   HENT:  Head: Normocephalic and atraumatic.  Right Ear: External ear normal.  Left Ear: External ear normal.  Nose: Nose normal.  Mouth/Throat: Oropharynx is clear and moist.  Eyes: Conjunctivae normal and EOM are normal. Pupils are equal, round, and reactive to light. Right eye exhibits no discharge. Left eye exhibits no discharge. No scleral icterus.  Neck: Normal range of motion. Neck supple. No JVD present. Carotid bruit is not present. No thyromegaly present.  Cardiovascular: Normal rate, regular rhythm, normal heart sounds and intact distal pulses.  Exam reveals no gallop.   Pulmonary/Chest: Effort normal and breath sounds normal. No respiratory distress. She has no wheezes.  Abdominal: Soft. Bowel sounds are normal. She exhibits no distension, no abdominal bruit and no mass. There is no tenderness.  Genitourinary: Vagina normal. No  breast swelling, tenderness, discharge or bleeding. There is no rash, tenderness or lesion on the right labia. There is no rash, tenderness or lesion on the left labia. Uterus is enlarged. Uterus is not tender. Cervix exhibits no motion tenderness, no discharge and no friability. Right adnexum displays no mass, no tenderness and no fullness. Left adnexum displays no mass and no fullness. No bleeding around the vagina. No vaginal discharge found.       Breast exam: No mass, nodules, thickening, tenderness, bulging, retraction, inflamation, nipple discharge or skin changes noted.  No axillary or clavicular LA.  Chaperoned exam.    Enlarged/ fibroid uterus is stable  Non tender  Musculoskeletal: She exhibits no edema and no tenderness.  Lymphadenopathy:    She has no cervical adenopathy.  Neurological: She is alert. She has normal reflexes. No cranial nerve deficit. She exhibits normal muscle tone. Coordination normal.  Skin: Skin is warm and dry. No rash noted. No erythema. No pallor.  Psychiatric: She has a normal mood and affect.          Assessment & Plan:

## 2012-06-19 NOTE — Assessment & Plan Note (Signed)
Reviewed health habits including diet and exercise and skin cancer prevention Also reviewed health mt list, fam hx and immunizations  Lab today Disc need for healthy diet and exercise  Flu shot today

## 2012-06-19 NOTE — Assessment & Plan Note (Signed)
Scheduled annual screening mammogram Nl breast exam today  Encouraged monthly self exams   

## 2012-06-19 NOTE — Assessment & Plan Note (Signed)
This appears stable by exam-pt tolerates well  Rev last [place]  Enl uterus with fibroids

## 2012-06-22 ENCOUNTER — Encounter: Payer: Self-pay | Admitting: *Deleted

## 2012-06-26 ENCOUNTER — Encounter: Payer: Self-pay | Admitting: *Deleted

## 2012-07-10 ENCOUNTER — Ambulatory Visit
Admission: RE | Admit: 2012-07-10 | Discharge: 2012-07-10 | Disposition: A | Discharge status: Home / Self Care | Payer: BC Managed Care – PPO | Source: Ambulatory Visit | Attending: Family Medicine | Admitting: Family Medicine

## 2012-07-10 DIAGNOSIS — Z1231 Encounter for screening mammogram for malignant neoplasm of breast: Secondary | ICD-10-CM

## 2012-07-15 ENCOUNTER — Other Ambulatory Visit: Payer: Self-pay | Admitting: Family Medicine

## 2012-07-15 DIAGNOSIS — R928 Other abnormal and inconclusive findings on diagnostic imaging of breast: Secondary | ICD-10-CM

## 2012-07-16 ENCOUNTER — Telehealth: Payer: Self-pay

## 2012-07-16 NOTE — Telephone Encounter (Signed)
Pt left v/m with no message; left pt v/m to call back.

## 2012-07-23 ENCOUNTER — Ambulatory Visit
Admission: RE | Admit: 2012-07-23 | Discharge: 2012-07-23 | Disposition: A | Discharge status: Home / Self Care | Payer: BC Managed Care – PPO | Source: Ambulatory Visit | Attending: Family Medicine | Admitting: Family Medicine

## 2012-07-23 ENCOUNTER — Other Ambulatory Visit: Payer: BC Managed Care – PPO

## 2012-07-23 DIAGNOSIS — R928 Other abnormal and inconclusive findings on diagnostic imaging of breast: Secondary | ICD-10-CM

## 2012-07-24 NOTE — Telephone Encounter (Signed)
Spoke with pt; pt said question was already resolved.

## 2013-03-14 IMAGING — MG MM DIAGNOSTIC LTD LEFT
2 series · 2 of 2 positions shown · non-contrast
Comparison: 07/10/2012 and 02/18/2011 mammograms.

CLINICAL DATA: 56-year-old female with abnormal screening
mammogram - possible left breast mass.

DIGITAL DIAGNOSTIC LEFT MAMMOGRAM WITH CAD AND LEFT BREAST
ULTRASOUND:

[L CC]
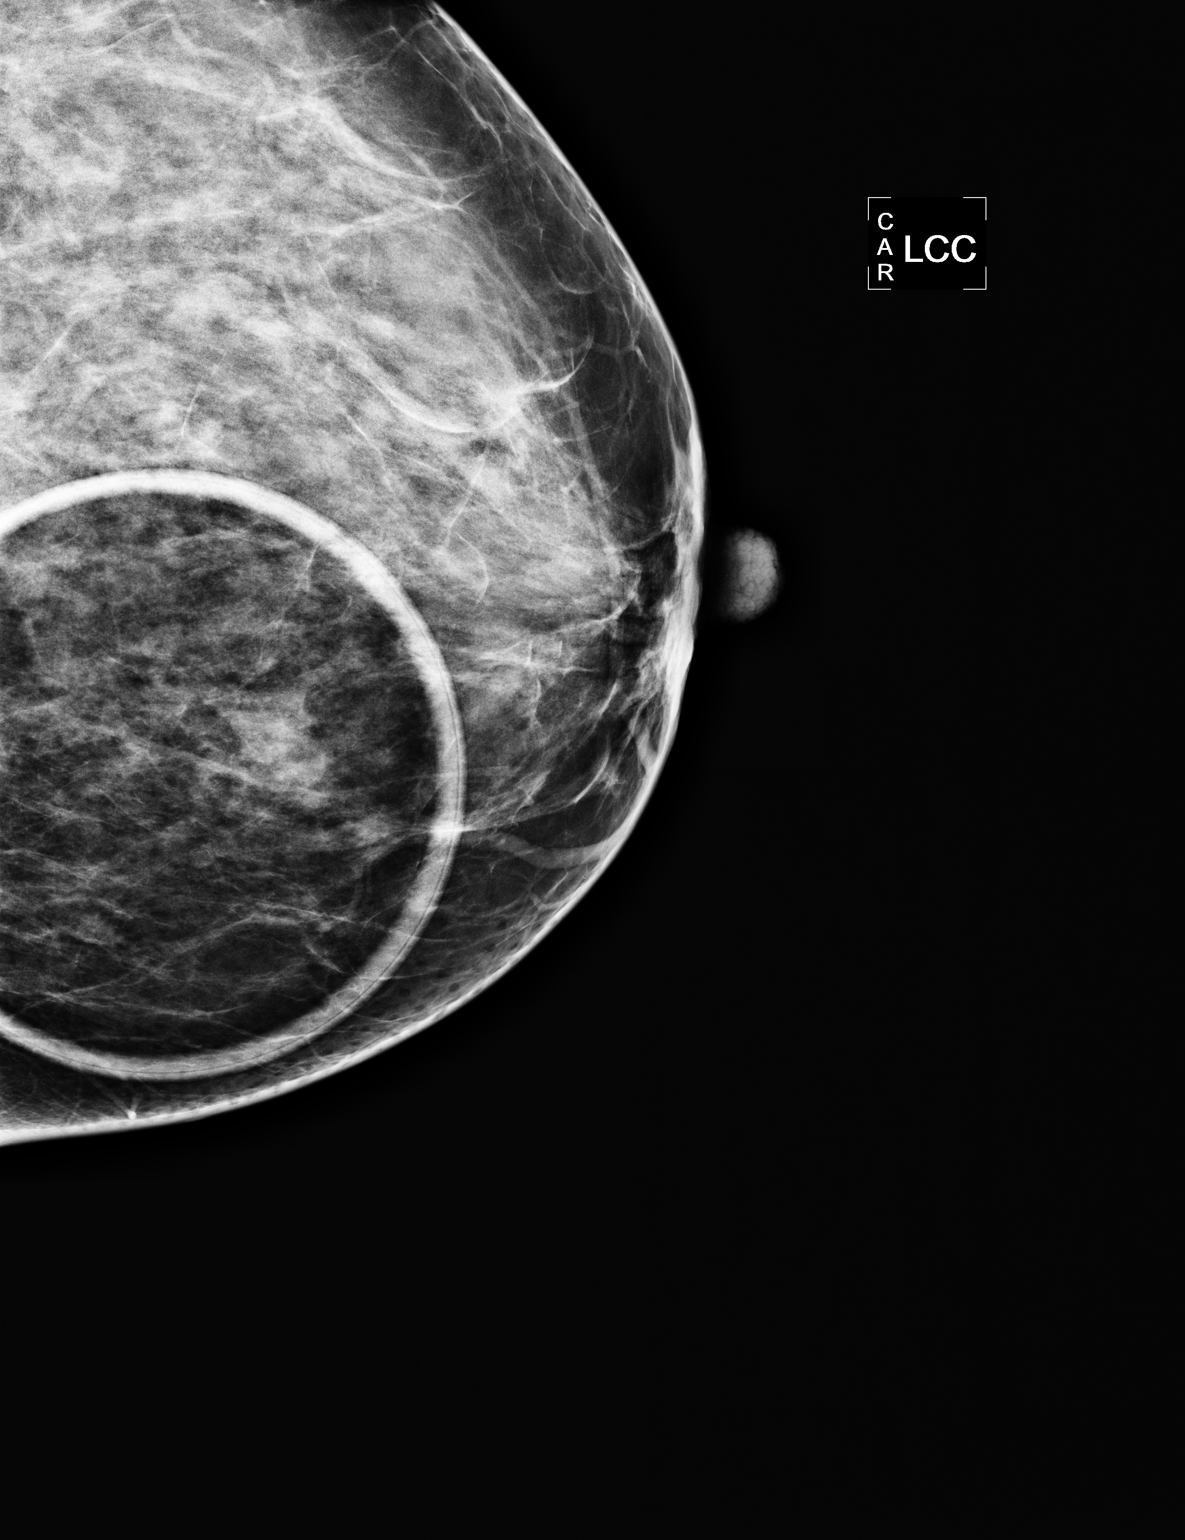

[L ML]
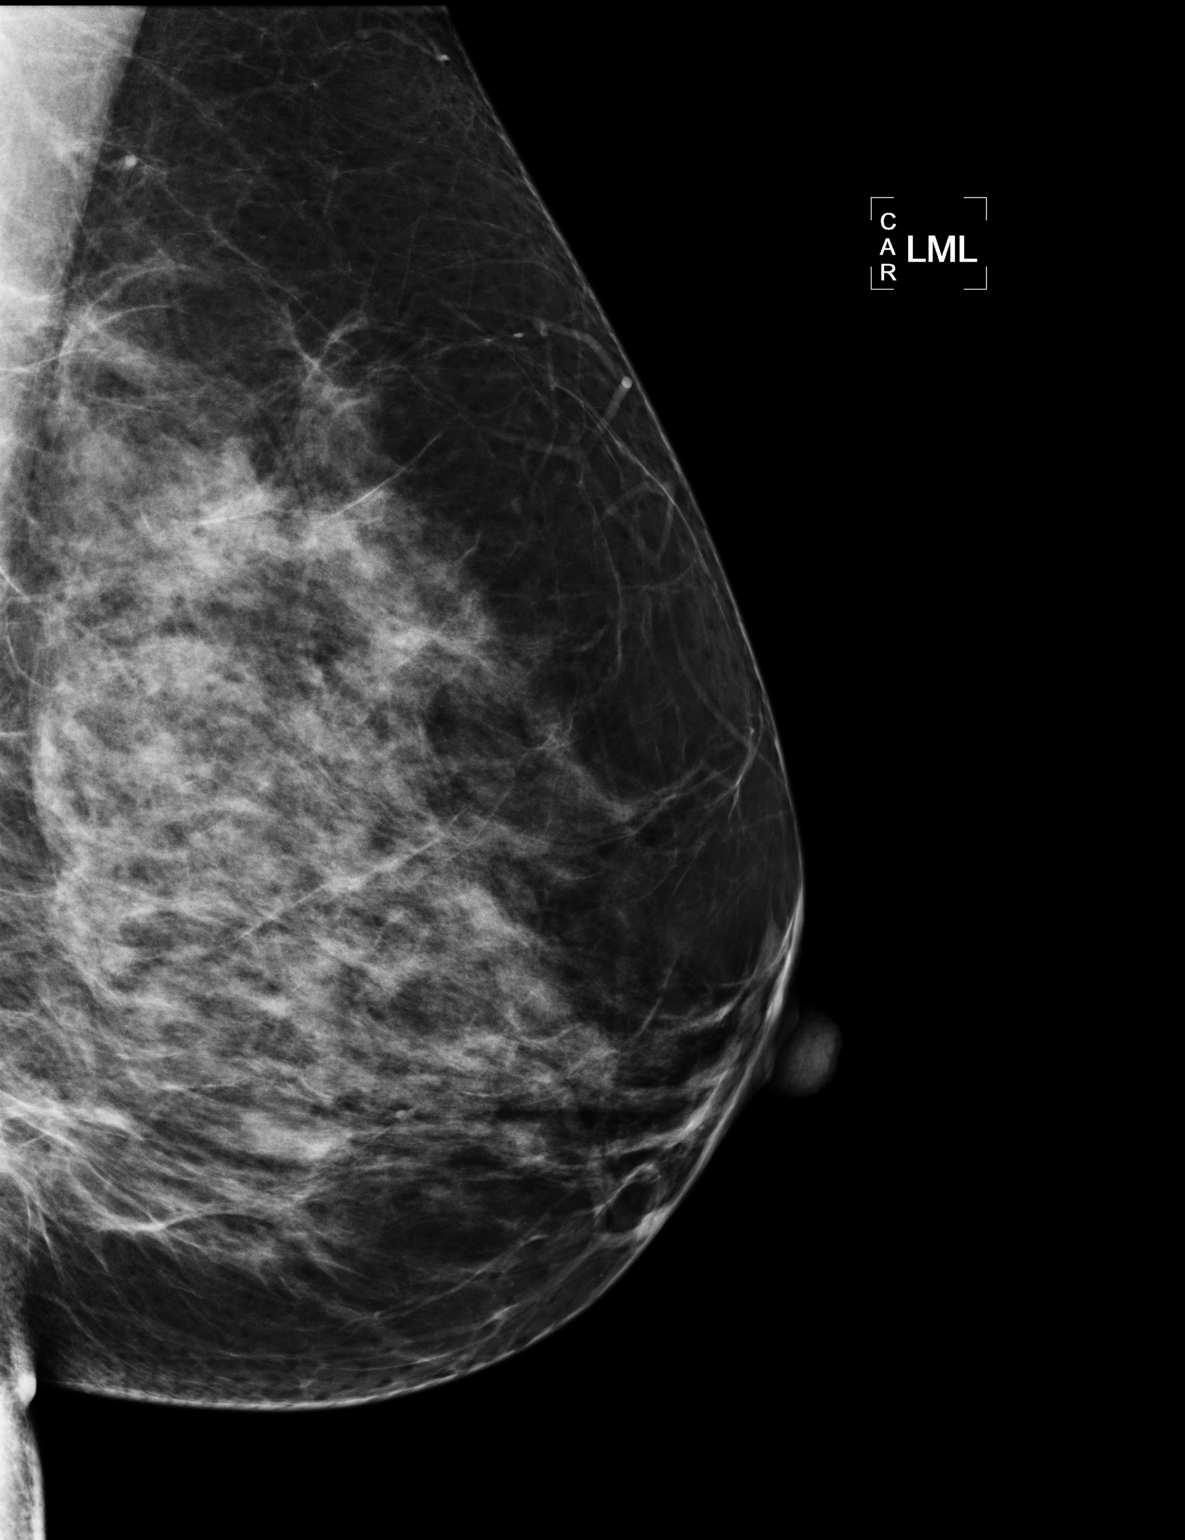

[2 of 2 positions shown; findings below may reference images not displayed]

FINDINGS: CC spot compression and ML views of the left breast
again demonstrates heterogeneously dense breast tissue.
A faint circumscribed oval mass within the inner left breast is
identified.
Mammographic images were processed with CAD.

On physical exam, no palpable abnormalities are identified within
the left breast.

Ultrasound is performed, showing a 7 x 10 x 10 mm simple cyst at
the 8 o'clock position of the left breast 4 cm from the nipple,
corresponding to the mammographic finding.
IMPRESSION: Simple cyst in the inner lower left breast, corresponding to the
screening study finding.

BI-RADS CATEGORY 2:  Benign finding(s).

RECOMMENDATION:
Bilateral screening mammograms in 1 year.

I discussed the findings and recommendations with the patient and
her questions answered.  She was encouraged to begin/continue
monthly self exams and to contact her primary physician if any
changes noted. A written report was given to the patient.

## 2013-03-14 IMAGING — US US BREAST*L*
1 series · 6 of 6 positions shown · non-contrast
Comparison: 07/10/2012 and 02/18/2011 mammograms.

CLINICAL DATA: 56-year-old female with abnormal screening
mammogram - possible left breast mass.

DIGITAL DIAGNOSTIC LEFT MAMMOGRAM WITH CAD AND LEFT BREAST
ULTRASOUND:

[Series 1: us breast*left* · 6 of 6 slices shown]
[im 1/6]
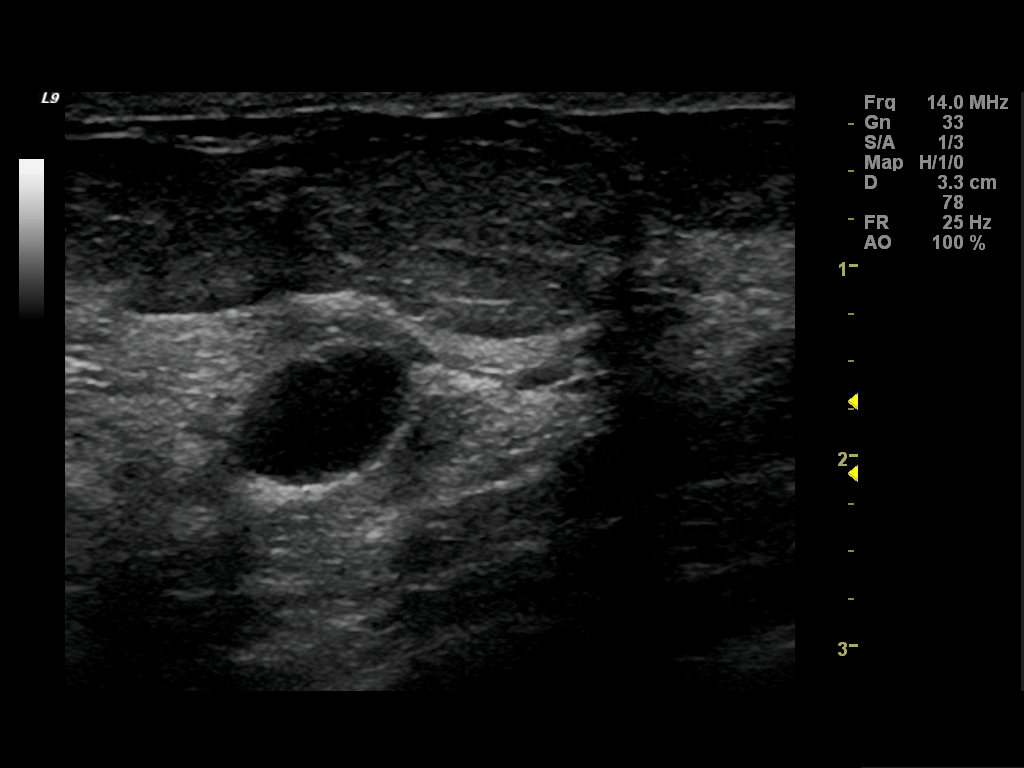
[im 2/6]
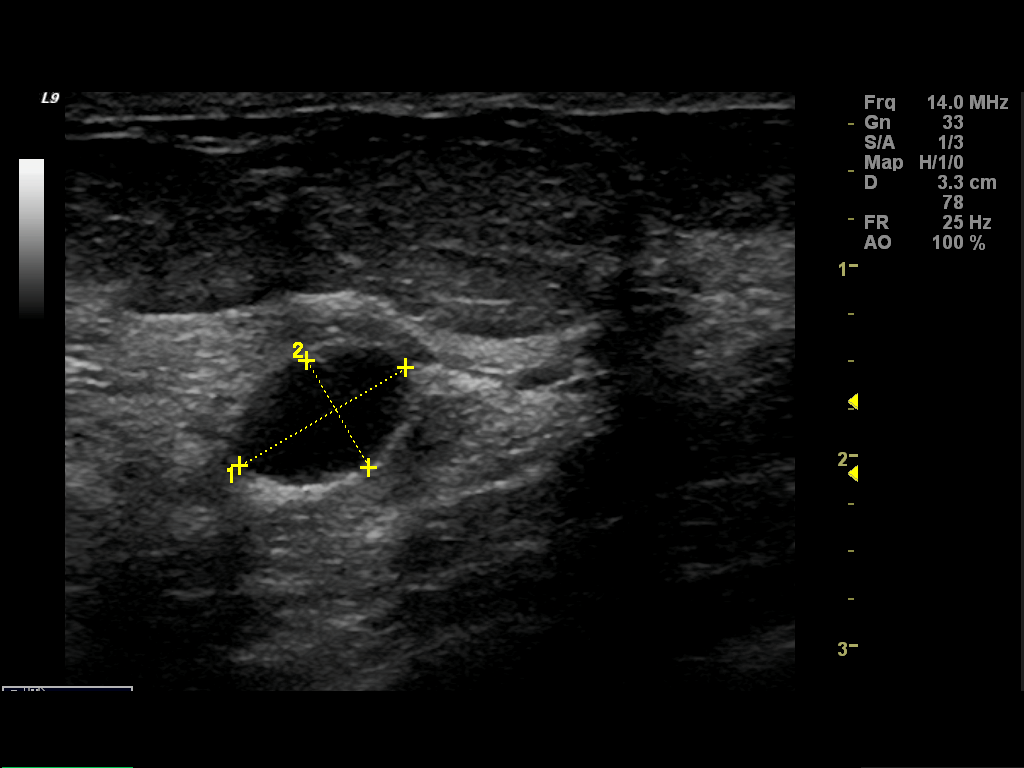
[im 3/6]
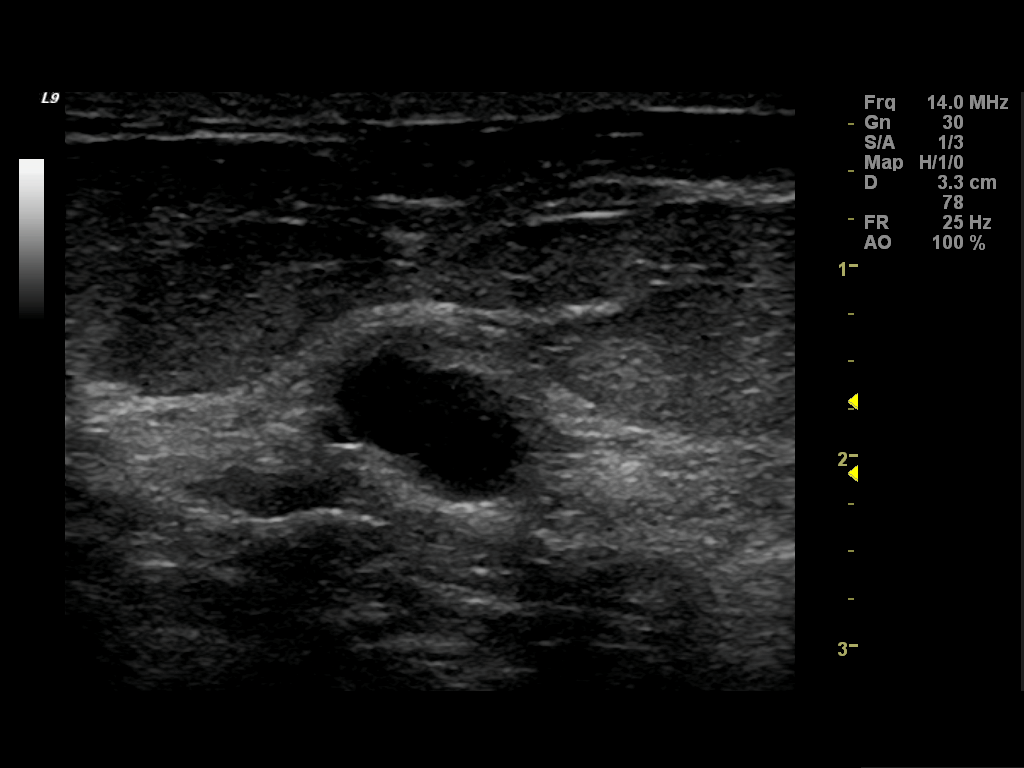
[im 4/6]
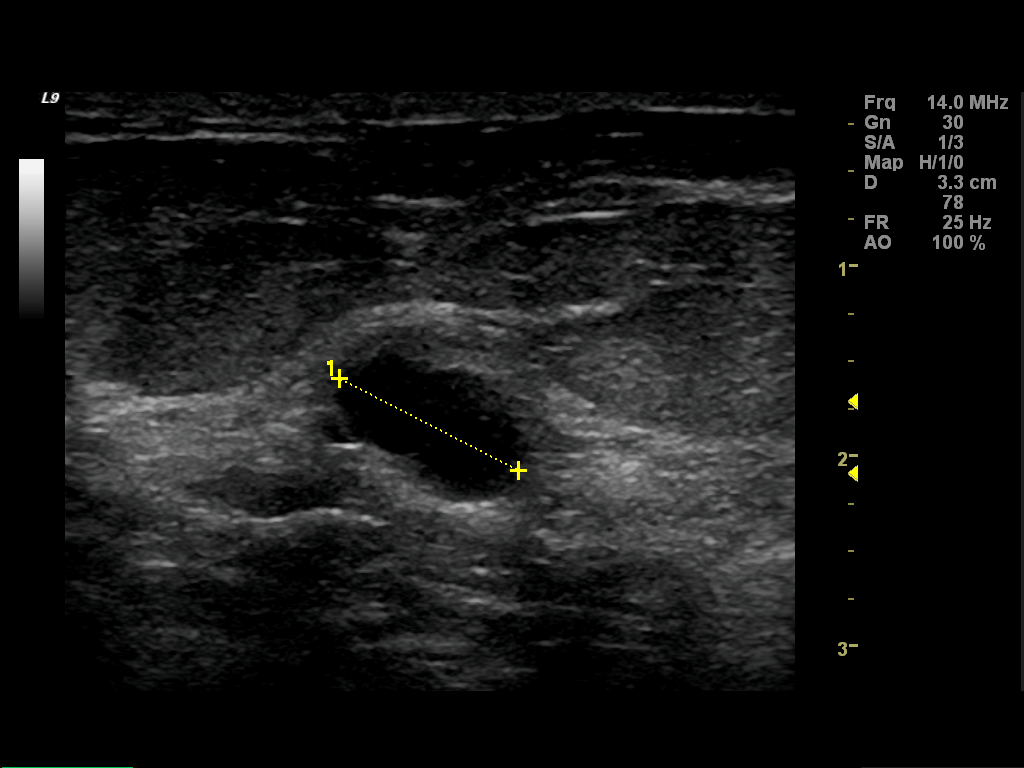
[im 5/6]
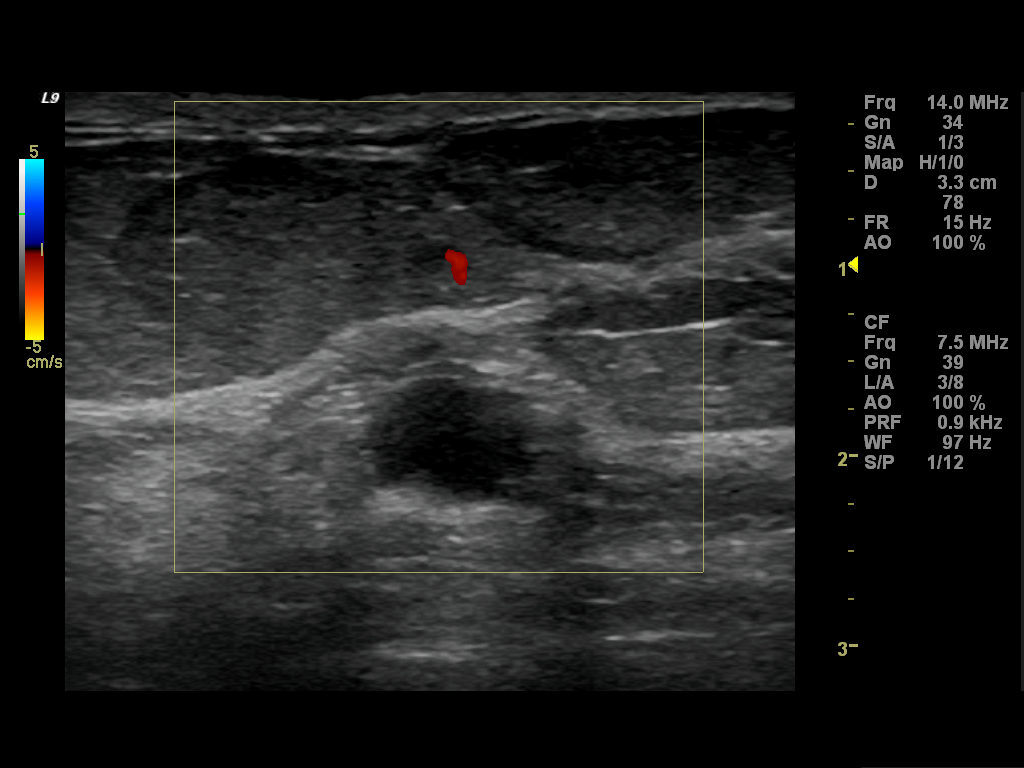
[im 6/6]
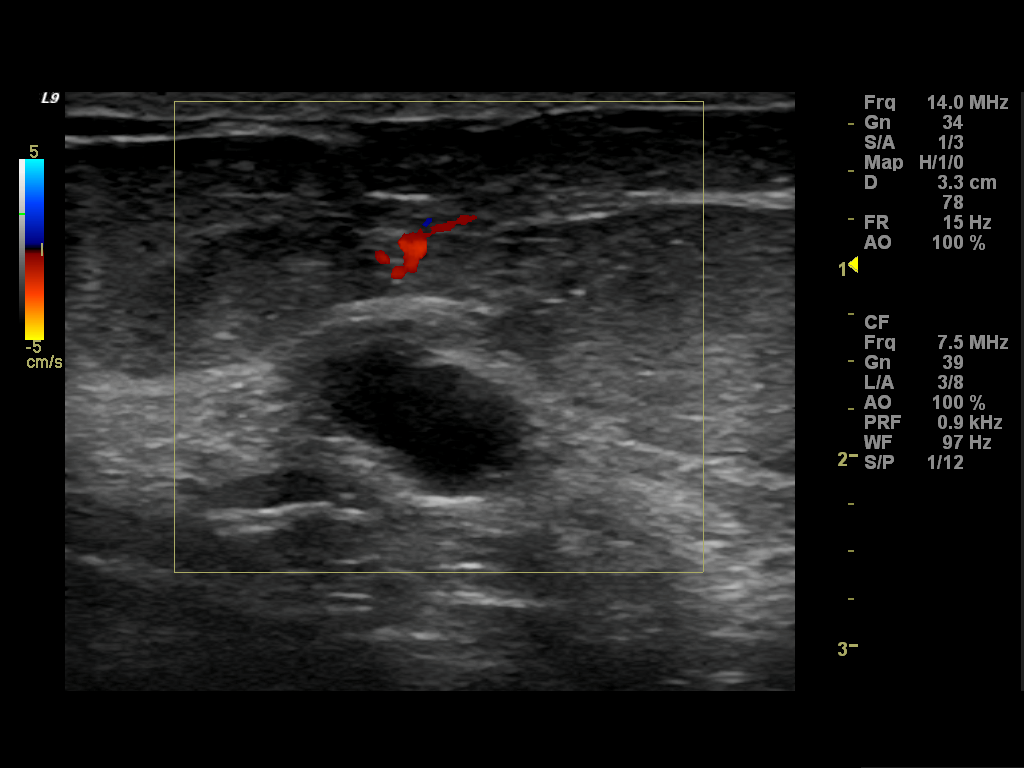

[6 of 6 positions shown; findings below may reference images not displayed]

FINDINGS: CC spot compression and ML views of the left breast
again demonstrates heterogeneously dense breast tissue.
A faint circumscribed oval mass within the inner left breast is
identified.
Mammographic images were processed with CAD.

On physical exam, no palpable abnormalities are identified within
the left breast.

Ultrasound is performed, showing a 7 x 10 x 10 mm simple cyst at
the 8 o'clock position of the left breast 4 cm from the nipple,
corresponding to the mammographic finding.
IMPRESSION: Simple cyst in the inner lower left breast, corresponding to the
screening study finding.

BI-RADS CATEGORY 2:  Benign finding(s).

RECOMMENDATION:
Bilateral screening mammograms in 1 year.

I discussed the findings and recommendations with the patient and
her questions answered.  She was encouraged to begin/continue
monthly self exams and to contact her primary physician if any
changes noted. A written report was given to the patient.

## 2013-07-01 ENCOUNTER — Ambulatory Visit (INDEPENDENT_AMBULATORY_CARE_PROVIDER_SITE_OTHER): Payer: BC Managed Care – PPO | Admitting: Family Medicine

## 2013-07-01 ENCOUNTER — Encounter: Payer: Self-pay | Admitting: Family Medicine

## 2013-07-01 VITALS — BP 120/80 | HR 73 | Temp 98.2°F | Ht 61.0 in | Wt 202.0 lb

## 2013-07-01 DIAGNOSIS — A088 Other specified intestinal infections: Secondary | ICD-10-CM

## 2013-07-01 DIAGNOSIS — Z23 Encounter for immunization: Secondary | ICD-10-CM

## 2013-07-01 DIAGNOSIS — A084 Viral intestinal infection, unspecified: Secondary | ICD-10-CM

## 2013-07-01 DIAGNOSIS — J069 Acute upper respiratory infection, unspecified: Secondary | ICD-10-CM

## 2013-07-01 MED ORDER — MONTELUKAST SODIUM 10 MG PO TABS: 10.0000 mg | ORAL_TABLET | Freq: Every day | ORAL | Status: DC

## 2013-07-01 NOTE — Patient Instructions (Signed)
Push fluids, return to normal diet.  

## 2013-07-01 NOTE — Assessment & Plan Note (Signed)
Push fluids, rest. Symptomatic care. 

## 2013-07-01 NOTE — Progress Notes (Signed)
  Subjective:    Patient ID: Isabella Ramirez, female    DOB: September 02, 1956, 57 y.o.   MRN: [id number]  HPI  57 year old female pt of Dr. Royden Purl presents with  2 weeks of cough, chest congestion, allergies... improvedsome. Was feeling tired and run down. In last 2 days  diarrhea and emesis began.  No further diarrhea or vomiting today.  Still with dry cough. No SOB. headhache this AM.. Improved with OTC headache med. No ear pain, no sinus pressure.  No fever.  She has been keeping down fluids well now.  No abdominal pain.  Used alkselter plus 2 weeks ago, pepto bismol yesterday.      Review of Systems  Constitutional: Negative for fever and fatigue.  HENT: Negative for ear pain.   Eyes: Negative for pain.  Respiratory: Negative for chest tightness and shortness of breath.   Cardiovascular: Negative for chest pain, palpitations and leg swelling.  Gastrointestinal: Negative for abdominal pain.  Genitourinary: Negative for dysuria.       Objective:   Physical Exam  Constitutional: Vital signs are normal. She appears well-developed and well-nourished. She is cooperative.  Non-toxic appearance. She does not appear ill. No distress.  HENT:  Head: Normocephalic.  Right Ear: Hearing, tympanic membrane, external ear and ear canal normal. Tympanic membrane is not erythematous, not retracted and not bulging.  Left Ear: Hearing, tympanic membrane, external ear and ear canal normal. Tympanic membrane is not erythematous, not retracted and not bulging.  Nose: No mucosal edema or rhinorrhea. Right sinus exhibits no maxillary sinus tenderness and no frontal sinus tenderness. Left sinus exhibits no maxillary sinus tenderness and no frontal sinus tenderness.  Mouth/Throat: Uvula is midline, oropharynx is clear and moist and mucous membranes are normal.  Eyes: Conjunctivae, EOM and lids are normal. Pupils are equal, round, and reactive to light. Lids are everted and swept, no foreign bodies found.   Neck: Trachea normal and normal range of motion. Neck supple. Carotid bruit is not present. No mass and no thyromegaly present.  Cardiovascular: Normal rate, regular rhythm, S1 normal, S2 normal, normal heart sounds, intact distal pulses and normal pulses.  Exam reveals no gallop and no friction rub.   No murmur heard. Pulmonary/Chest: Effort normal and breath sounds normal. Not tachypneic. No respiratory distress. She has no decreased breath sounds. She has no wheezes. She has no rhonchi. She has no rales.  Abdominal: Soft. Normal appearance and bowel sounds are normal. She exhibits mass. There is no tenderness. There is no CVA tenderness.  Central mass secondary to fibroids, stable per pt  Neurological: She is alert.  Skin: Skin is warm, dry and intact. No rash noted.  Psychiatric: Her speech is normal and behavior is normal. Judgment and thought content normal. Her mood appears not anxious. Cognition and memory are normal. She does not exhibit a depressed mood.          Assessment & Plan:

## 2013-07-01 NOTE — Assessment & Plan Note (Signed)
Infection resolved.. Likely just remaining post inflammatory bronchospasm cough.

## 2013-10-05 ENCOUNTER — Ambulatory Visit (INDEPENDENT_AMBULATORY_CARE_PROVIDER_SITE_OTHER): Payer: BC Managed Care – PPO | Admitting: Family Medicine

## 2013-10-05 ENCOUNTER — Other Ambulatory Visit (HOSPITAL_COMMUNITY)
Admission: RE | Admit: 2013-10-05 | Discharge: 2013-10-05 | Disposition: A | Discharge status: Home / Self Care | Payer: BC Managed Care – PPO | Source: Ambulatory Visit | Attending: Family Medicine | Admitting: Family Medicine

## 2013-10-05 ENCOUNTER — Encounter: Payer: Self-pay | Admitting: Family Medicine

## 2013-10-05 VITALS — BP 100/70 | HR 94 | Temp 97.4°F | Ht 61.0 in | Wt 199.8 lb

## 2013-10-05 DIAGNOSIS — N852 Hypertrophy of uterus: Secondary | ICD-10-CM

## 2013-10-05 DIAGNOSIS — Z01419 Encounter for gynecological examination (general) (routine) without abnormal findings: Secondary | ICD-10-CM

## 2013-10-05 DIAGNOSIS — D259 Leiomyoma of uterus, unspecified: Secondary | ICD-10-CM

## 2013-10-05 DIAGNOSIS — Z0000 Encounter for general adult medical examination without abnormal findings: Secondary | ICD-10-CM

## 2013-10-05 LAB — CBC WITH DIFFERENTIAL/PLATELET
Basophils Absolute: 0 10*3/uL (ref 0.0–0.1)
Basophils Relative: 0.7 % (ref 0.0–3.0)
Eosinophils Absolute: 0.1 10*3/uL (ref 0.0–0.7)
Eosinophils Relative: 3.9 % (ref 0.0–5.0)
HCT: 44.7 % (ref 36.0–46.0)
Hemoglobin: 14.7 g/dL (ref 12.0–15.0)
Lymphocytes Relative: 39.2 % (ref 12.0–46.0)
Lymphs Abs: 1.3 10*3/uL (ref 0.7–4.0)
MCHC: 33 g/dL (ref 30.0–36.0)
MCV: 87.5 fl (ref 78.0–100.0)
Monocytes Absolute: 0.3 10*3/uL (ref 0.1–1.0)
Monocytes Relative: 8.8 % (ref 3.0–12.0)
Neutro Abs: 1.5 10*3/uL (ref 1.4–7.7)
Neutrophils Relative %: 47.4 % (ref 43.0–77.0)
Platelets: 292 10*3/uL (ref 150.0–400.0)
RBC: 5.11 Mil/uL (ref 3.87–5.11)
RDW: 13.7 % (ref 11.5–14.6)
WBC: 3.3 10*3/uL — ABNORMAL LOW (ref 4.5–10.5)

## 2013-10-05 LAB — COMPREHENSIVE METABOLIC PANEL
ALT: 25 U/L (ref 0–35)
AST: 26 U/L (ref 0–37)
Albumin: 4 g/dL (ref 3.5–5.2)
Alkaline Phosphatase: 72 U/L (ref 39–117)
BUN: 11 mg/dL (ref 6–23)
CO2: 30 mEq/L (ref 19–32)
Calcium: 9.3 mg/dL (ref 8.4–10.5)
Chloride: 103 mEq/L (ref 96–112)
Creatinine, Ser: 0.7 mg/dL (ref 0.4–1.2)
GFR: 104.38 mL/min (ref >60.00)
Glucose, Bld: 83 mg/dL (ref 70–99)
Potassium: 4.2 mEq/L (ref 3.5–5.1)
Sodium: 140 mEq/L (ref 135–145)
Total Bilirubin: 0.8 mg/dL (ref 0.3–1.2)
Total Protein: 7.5 g/dL (ref 6.0–8.3)

## 2013-10-05 LAB — LIPID PANEL
Cholesterol: 193 mg/dL (ref 0–200)
HDL: 63.3 mg/dL (ref >39.00)
LDL Cholesterol: 116 mg/dL — ABNORMAL HIGH (ref 0–99)
Total CHOL/HDL Ratio: 3
Triglycerides: 70 mg/dL (ref 0.0–149.0)
VLDL: 14 mg/dL (ref 0.0–40.0)

## 2013-10-05 LAB — TSH: TSH: 1.27 u[IU]/mL (ref 0.35–5.50)

## 2013-10-05 NOTE — Patient Instructions (Signed)
Schedule a pelvic ultrasound at check out  Take care of yourself  Labs today  Work on healthy diet and exercise

## 2013-10-05 NOTE — Progress Notes (Signed)
Pre-visit discussion using our clinic review tool. No additional management support is needed unless otherwise documented below in the visit note.  

## 2013-10-05 NOTE — Progress Notes (Signed)
Subjective:    Patient ID: Isabella Ramirez, female    DOB: Jan 17, 1956, 58 y.o.   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 Routine general medical examination at a health care facility 02/15/2011  . Routine gynecological examination 02/15/2011  . Other screening mammogram 02/15/2011  . LEIOMYOMA OF UTERUS, UNSPECIFIED 08/29/2007  . UTERINE ENLARGEMENT 08/03/2007  . ALLERGIC RHINITIS 04/23/2007  . ASTHMA 04/23/2007   Past Medical History  Diagnosis Date  . Allergy     allergic rhinitis  . Asthma     as a child  . Uterine fibroids affecting pregnancy   . Ovarian cyst    Past Surgical History  Procedure Laterality Date  . Leep  01/2005  . Popliteal synovial cyst excision     History  Substance Use Topics  . Smoking status: Never Smoker   . Smokeless tobacco: Never Used  . Alcohol Use: Yes     Comment: occ wine   Family History  Problem Relation Age of Onset  . Heart disease Mother   . Hypertension Mother    Allergies  Allergen Reactions  . Shellfish Allergy    Current Outpatient Prescriptions on File Prior to Visit  Medication Sig Dispense Refill  . BIOTIN FORTE PO Take 1 tablet by mouth daily.        . calcium gluconate 500 MG tablet Take 500 mg by mouth daily.        . cetirizine (ZYRTEC) 10 MG tablet Take 10 mg by mouth daily.        . ferrous  sulfate 325 (65 FE) MG tablet Take 325 mg by mouth daily with breakfast.        . montelukast (SINGULAIR) 10 MG tablet Take 1 tablet (10 mg total) by mouth daily.  30 tablet  11  . Multiple Vitamin (MULTIVITAMIN) capsule Take 1 capsule by mouth daily.        . vitamin E 400 UNIT capsule Take 400 Units by mouth daily.         No current facility-administered medications on file prior to visit.      Review of Systems Review of Systems  Constitutional: Negative for fever, appetite change, fatigue and unexpected weight change.  Eyes: Negative for pain and visual disturbance.  Respiratory: Negative for cough and shortness of breath.   Cardiovascular: Negative for cp or palpitations    Gastrointestinal: Negative for nausea, diarrhea and constipation.  Genitourinary: Negative for urgency and frequency.  Skin: Negative for pallor or rash   MSK pos for knee pain without swelling  Neurological: Negative for weakness, light-headedness, numbness and headaches.  Hematological: Negative for adenopathy. Does not bruise/bleed easily.  Psychiatric/Behavioral: Negative for dysphoric mood.  The patient is not nervous/anxious.         Objective:   Physical Exam  Constitutional: She appears well-developed and well-nourished. No distress.  obese and well appearing   HENT:  Head: Normocephalic and atraumatic.  Right Ear: External ear normal.  Left Ear: External ear normal.  Mouth/Throat: Oropharynx is clear and moist.  Eyes: Conjunctivae and EOM are normal. Pupils are equal, round, and reactive to light. No scleral icterus.  Neck: Normal range of motion. Neck supple. No JVD present. Carotid bruit is not present. No thyromegaly present.  Cardiovascular: Normal rate, regular rhythm, normal heart sounds and intact distal pulses.  Exam reveals no gallop.   Pulmonary/Chest: Effort normal and breath sounds normal. No respiratory distress. She has no wheezes. She exhibits no tenderness.  Abdominal: Soft.  Bowel sounds are normal. She exhibits no distension, no abdominal bruit and no mass. There is no tenderness.  Genitourinary: No breast swelling, tenderness, discharge or bleeding. There is no rash, tenderness or lesion on the right labia. There is no rash, tenderness or lesion on the left labia. Uterus is enlarged. Uterus is not tender. Cervix exhibits no motion tenderness, no discharge and no friability. Right adnexum displays no mass, no tenderness and no fullness. Left adnexum displays no mass, no tenderness and no fullness. No bleeding around the vagina.  Enlarged uterus with fibroids   Breast exam: No mass, nodules, thickening, tenderness, bulging, retraction, inflamation, nipple discharge or skin changes noted.  No axillary or clavicular LA.  Chaperoned exam.    Musculoskeletal: Normal range of motion. She exhibits no edema and no tenderness.  Lymphadenopathy:    She has no cervical adenopathy.  Neurological: She is alert. She has normal reflexes. No cranial nerve deficit. She exhibits normal muscle tone. Coordination normal.  Skin: Skin is warm and dry. No rash noted. No erythema. No pallor.  Psychiatric: She has a normal mood and affect.          Assessment & Plan:

## 2013-10-06 ENCOUNTER — Encounter: Payer: Self-pay | Admitting: *Deleted

## 2013-10-06 NOTE — Assessment & Plan Note (Signed)
Annual exam and pap  Hx of LEEP in 06 sched [place] for enl uterus/ fibroids

## 2013-10-06 NOTE — Assessment & Plan Note (Signed)
Seems to be advanced from last exam - would not expect fibroids to enl after menopause Pt as a rule is not symptomatic  Scheduled ultrasound

## 2013-10-06 NOTE — Assessment & Plan Note (Signed)
Reviewed health habits including diet and exercise and skin cancer prevention Reviewed appropriate screening tests for age  Also reviewed health mt list, fam hx and immunization status , as well as social and family history   Labs today  

## 2013-10-06 NOTE — Assessment & Plan Note (Signed)
[place] planned  Markedly enl uterus

## 2013-10-08 ENCOUNTER — Encounter: Payer: Self-pay | Admitting: *Deleted

## 2013-10-08 ENCOUNTER — Encounter: Payer: Self-pay | Admitting: Family Medicine

## 2013-10-08 LAB — HM PAP SMEAR: HM Pap smear: NORMAL

## 2013-10-14 ENCOUNTER — Ambulatory Visit
Admission: RE | Admit: 2013-10-14 | Discharge: 2013-10-14 | Disposition: A | Discharge status: Home / Self Care | Payer: BC Managed Care – PPO | Source: Ambulatory Visit | Attending: Family Medicine | Admitting: Family Medicine

## 2013-10-14 DIAGNOSIS — N852 Hypertrophy of uterus: Secondary | ICD-10-CM

## 2013-10-14 DIAGNOSIS — D259 Leiomyoma of uterus, unspecified: Secondary | ICD-10-CM

## 2014-05-04 ENCOUNTER — Encounter (INDEPENDENT_AMBULATORY_CARE_PROVIDER_SITE_OTHER): Payer: Self-pay

## 2014-05-04 ENCOUNTER — Ambulatory Visit (INDEPENDENT_AMBULATORY_CARE_PROVIDER_SITE_OTHER): Payer: BC Managed Care – PPO | Admitting: Family Medicine

## 2014-05-04 ENCOUNTER — Encounter: Payer: Self-pay | Admitting: Family Medicine

## 2014-05-04 VITALS — BP 128/72 | HR 93 | Temp 98.5°F | Ht 61.0 in | Wt 202.0 lb

## 2014-05-04 DIAGNOSIS — D259 Leiomyoma of uterus, unspecified: Secondary | ICD-10-CM

## 2014-05-04 DIAGNOSIS — F430 Acute stress reaction: Secondary | ICD-10-CM
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
## 2014-05-04 NOTE — Patient Instructions (Signed)
Let's take you out of work for the rest of the week for stress reaction  Get and appt with [facility] for counseling as soon as possible - to help you with coping skills  Take care of yourself  Stop at check out for gyn referral

## 2014-05-04 NOTE — Assessment & Plan Note (Signed)
Pt is ready for ref to gyn to disc tx options Having more discomfort from large fibroids

## 2014-05-04 NOTE — Progress Notes (Signed)
Pre visit review using our clinic review tool, if applicable. No additional management support is needed unless otherwise documented below in the visit note. 
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
## 2014-06-13 ENCOUNTER — Encounter (INDEPENDENT_AMBULATORY_CARE_PROVIDER_SITE_OTHER): Payer: Self-pay

## 2014-06-13 ENCOUNTER — Encounter (HOSPITAL_COMMUNITY)
Admission: RE | Admit: 2014-06-13 | Discharge: 2014-06-13 | Disposition: A | Discharge status: Home / Self Care | Payer: BC Managed Care – PPO | Source: Ambulatory Visit | Attending: Obstetrics & Gynecology | Admitting: Obstetrics & Gynecology

## 2014-06-13 ENCOUNTER — Encounter (HOSPITAL_COMMUNITY): Payer: Self-pay

## 2014-06-13 DIAGNOSIS — Z0000 Encounter for general adult medical examination without abnormal findings: Secondary | ICD-10-CM | POA: Diagnosis present

## 2014-06-13 DIAGNOSIS — D259 Leiomyoma of uterus, unspecified: Secondary | ICD-10-CM | POA: Insufficient documentation

## 2014-06-13 DIAGNOSIS — Z91013 Allergy to seafood: Secondary | ICD-10-CM | POA: Insufficient documentation

## 2014-06-13 DIAGNOSIS — J45909 Unspecified asthma, uncomplicated: Secondary | ICD-10-CM | POA: Insufficient documentation

## 2014-06-13 LAB — CBC
HCT: 44.5 % (ref 36.0–46.0)
Hemoglobin: 14.7 g/dL (ref 12.0–15.0)
MCH: 29.6 pg (ref 26.0–34.0)
MCHC: 33 g/dL (ref 30.0–36.0)
MCV: 89.7 fL (ref 78.0–100.0)
Platelets: 284 10*3/uL (ref 150–400)
RBC: 4.96 MIL/uL (ref 3.87–5.11)
RDW: 13 % (ref 11.5–15.5)
WBC: 3.7 10*3/uL — ABNORMAL LOW (ref 4.0–10.5)

## 2014-06-13 NOTE — Patient Instructions (Signed)
   Your procedure is scheduled on: Jun 27 2014 AT 730AM  Enter through the Main Entrance of [facility] at: 6AM Pick up the phone at the desk and dial [phone] and inform us of your arrival.  Please call this number if you have any problems the morning of surgery: [phone]  Remember: Do not eat food after midnight:OCT 18 Do not drink clear liquids after:OCT 18 Take these medicines the morning of surgery with a SIP OF WATER:  Do not wear jewelry, make-up, or FINGER nail polish No metal in your hair or on your body. Do not wear lotions, powders, perfumes.  You may wear deodorant.  Do not bring valuables to the hospital. Contacts, dentures or bridgework may not be worn into surgery.  Leave suitcase in the car. After Surgery it may be brought to your room. For patients being admitted to the hospital, checkout time is 11:00am the day of discharge.    Patients discharged on the day of surgery will not be allowed to drive home.

## 2014-06-25 NOTE — H&P (Signed)
Isabella Ramirez is an 58 y.o. female with symptomatic fibroid uterus; 20 weeks sized on exam.  She reports pelvic pressure and bladder symptoms.  She desires definitive management.    Pertinent Gynecological History: Menses: post-menopausal Bleeding: none Contraception: post menopausal status and tubal ligation DES exposure: unknown Blood transfusions: none Sexually transmitted diseases: no past history Previous GYN Procedures: LEEP  Last mammogram: normal Date: 2014 Last pap: normal Date: 2015 OB History: G2, P2   Menstrual History: Menarche age: n/a Patient's last menstrual period was 12/07/2010.    Past Medical History  Diagnosis Date  . Allergy     allergic rhinitis  . Asthma     as a child  . Uterine fibroids affecting pregnancy   . Ovarian cyst     Past Surgical History  Procedure Laterality Date  . Leep  01/2005  . Popliteal synovial cyst excision    . Tubal ligation      Family History  Problem Relation Age of Onset  . Heart disease Mother   . Hypertension Mother     Social History:  reports that she has never smoked. She has never used smokeless tobacco. She reports that she drinks alcohol. She reports that she does not use illicit drugs.  Allergies:  Allergies  Allergen Reactions  . Shellfish Allergy Hives and Itching    No prescriptions prior to admission    ROS  Last menstrual period 12/07/2010. Physical Exam  Constitutional: She is oriented to person, place, and time. She appears well-developed and well-nourished.  GI: Soft. There is no rebound and no guarding.  Neurological: She is alert and oriented to person, place, and time.  Skin: Skin is warm and dry.  Psychiatric: She has a normal mood and affect. Her behavior is normal.    No results found for this or any previous visit (from the past 24 hour(s)).  No results found.  Assessment/Plan: 58yo with symptomatic fibroids -TAH, BSO  MORRIS, MEGAN 06/25/2014, 10:06 PM

## 2014-06-27 ENCOUNTER — Encounter (HOSPITAL_COMMUNITY): Payer: BC Managed Care – PPO | Admitting: Anesthesiology

## 2014-06-27 ENCOUNTER — Inpatient Hospital Stay (HOSPITAL_COMMUNITY)
Admission: RE | Admit: 2014-06-27 | Discharge: 2014-06-29 | DRG: 743 | Disposition: A | Discharge status: Home / Self Care | Payer: BC Managed Care – PPO | Source: Ambulatory Visit | Attending: Obstetrics & Gynecology | Admitting: Obstetrics & Gynecology

## 2014-06-27 ENCOUNTER — Encounter (HOSPITAL_COMMUNITY)
Admission: RE | Disposition: A | Discharge status: Home / Self Care | Payer: Self-pay | Source: Ambulatory Visit | Attending: Obstetrics & Gynecology

## 2014-06-27 ENCOUNTER — Inpatient Hospital Stay (HOSPITAL_COMMUNITY): Payer: BC Managed Care – PPO | Admitting: Anesthesiology

## 2014-06-27 ENCOUNTER — Encounter (HOSPITAL_COMMUNITY): Payer: Self-pay | Admitting: Anesthesiology

## 2014-06-27 DIAGNOSIS — N838 Other noninflammatory disorders of ovary, fallopian tube and broad ligament: Secondary | ICD-10-CM | POA: Diagnosis present

## 2014-06-27 DIAGNOSIS — Z6837 Body mass index (BMI) 37.0-37.9, adult: Secondary | ICD-10-CM | POA: Diagnosis not present

## 2014-06-27 DIAGNOSIS — D251 Intramural leiomyoma of uterus: Secondary | ICD-10-CM | POA: Diagnosis present

## 2014-06-27 DIAGNOSIS — D252 Subserosal leiomyoma of uterus: Secondary | ICD-10-CM | POA: Diagnosis present

## 2014-06-27 DIAGNOSIS — Z90710 Acquired absence of both cervix and uterus: Secondary | ICD-10-CM | POA: Diagnosis present

## 2014-06-27 DIAGNOSIS — D259 Leiomyoma of uterus, unspecified: Secondary | ICD-10-CM | POA: Diagnosis present

## 2014-06-27 HISTORY — PX: ABDOMINAL HYSTERECTOMY: SHX81

## 2014-06-27 LAB — COMPREHENSIVE METABOLIC PANEL
ALT: 16 U/L (ref 0–35)
AST: 15 U/L (ref 0–37)
Albumin: 3.7 g/dL (ref 3.5–5.2)
Alkaline Phosphatase: 81 U/L (ref 39–117)
Anion gap: 10 (ref 5–15)
BUN: 14 mg/dL (ref 6–23)
CO2: 28 mEq/L (ref 19–32)
Calcium: 9.1 mg/dL (ref 8.4–10.5)
Chloride: 104 mEq/L (ref 96–112)
Creatinine, Ser: 0.66 mg/dL (ref 0.50–1.10)
GFR calc Af Amer: >90 mL/min (ref >90)
GFR calc non Af Amer: >90 mL/min (ref >90)
Glucose, Bld: 95 mg/dL (ref 70–99)
Potassium: 4.1 mEq/L (ref 3.7–5.3)
Sodium: 142 mEq/L (ref 137–147)
Total Bilirubin: 0.3 mg/dL (ref 0.3–1.2)
Total Protein: 7 g/dL (ref 6.0–8.3)

## 2014-06-27 LAB — CBC
HCT: 42.3 % (ref 36.0–46.0)
Hemoglobin: 14 g/dL (ref 12.0–15.0)
MCH: 29.7 pg (ref 26.0–34.0)
MCHC: 33.1 g/dL (ref 30.0–36.0)
MCV: 89.6 fL (ref 78.0–100.0)
Platelets: 285 10*3/uL (ref 150–400)
RBC: 4.72 MIL/uL (ref 3.87–5.11)
RDW: 12.9 % (ref 11.5–15.5)
WBC: 3.6 10*3/uL — ABNORMAL LOW (ref 4.0–10.5)

## 2014-06-27 LAB — PREGNANCY, URINE: Preg Test, Ur: NEGATIVE

## 2014-06-27 SURGERY — HYSTERECTOMY, ABDOMINAL
Anesthesia: General

## 2014-06-27 MED ORDER — CEFAZOLIN SODIUM-DEXTROSE 2-3 GM-% IV SOLR
2.0000 g | INTRAVENOUS | Status: AC
  Administered 2014-06-27: 2 g via INTRAVENOUS

## 2014-06-27 MED ORDER — PROPOFOL 10 MG/ML IV BOLUS
INTRAVENOUS | Status: DC | PRN
  Administered 2014-06-27: 160 mg via INTRAVENOUS

## 2014-06-27 MED ORDER — DEXAMETHASONE SODIUM PHOSPHATE 10 MG/ML IJ SOLN
INTRAMUSCULAR | Status: DC | PRN
  Administered 2014-06-27: 4 mg via INTRAVENOUS

## 2014-06-27 MED ORDER — SCOPOLAMINE 1 MG/3DAYS TD PT72
MEDICATED_PATCH | TRANSDERMAL | Status: AC
  Filled 2014-06-27: qty 1

## 2014-06-27 MED ORDER — IBUPROFEN 800 MG PO TABS
800.0000 mg | ORAL_TABLET | Freq: Three times a day (TID) | ORAL | Status: DC | PRN
Start: 2014-06-27 — End: 2014-06-27

## 2014-06-27 MED ORDER — NEOSTIGMINE METHYLSULFATE 10 MG/10ML IV SOLN
INTRAVENOUS | Status: DC | PRN
  Administered 2014-06-27: 3 mg via INTRAVENOUS

## 2014-06-27 MED ORDER — GLYCOPYRROLATE 0.2 MG/ML IJ SOLN
INTRAMUSCULAR | Status: DC | PRN
  Administered 2014-06-27: 0.6 mg via INTRAVENOUS

## 2014-06-27 MED ORDER — HYDROMORPHONE HCL 1 MG/ML IJ SOLN
0.2000 mg | INTRAMUSCULAR | Status: DC | PRN
  Administered 2014-06-27 – 2014-06-28 (×4): 0.6 mg via INTRAVENOUS
  Filled 2014-06-27 (×4): qty 1

## 2014-06-27 MED ORDER — MIDAZOLAM HCL 2 MG/2ML IJ SOLN
INTRAMUSCULAR | Status: AC
  Filled 2014-06-27: qty 2

## 2014-06-27 MED ORDER — NEOSTIGMINE METHYLSULFATE 10 MG/10ML IV SOLN
INTRAVENOUS | Status: AC
  Filled 2014-06-27: qty 1

## 2014-06-27 MED ORDER — PHENYLEPHRINE 40 MCG/ML (10ML) SYRINGE FOR IV PUSH (FOR BLOOD PRESSURE SUPPORT)
PREFILLED_SYRINGE | INTRAVENOUS | Status: AC
  Filled 2014-06-27: qty 5

## 2014-06-27 MED ORDER — HYDROMORPHONE HCL 1 MG/ML IJ SOLN
INTRAMUSCULAR | Status: DC | PRN
  Administered 2014-06-27: 1 mg via INTRAVENOUS

## 2014-06-27 MED ORDER — ROCURONIUM BROMIDE 100 MG/10ML IV SOLN
INTRAVENOUS | Status: AC
  Filled 2014-06-27: qty 1

## 2014-06-27 MED ORDER — ONDANSETRON HCL 4 MG/2ML IJ SOLN
INTRAMUSCULAR | Status: AC
  Filled 2014-06-27: qty 2

## 2014-06-27 MED ORDER — INFLUENZA VAC SPLIT QUAD 0.5 ML IM SUSY
0.5000 mL | PREFILLED_SYRINGE | INTRAMUSCULAR | Status: AC
  Administered 2014-06-28: 0.5 mL via INTRAMUSCULAR

## 2014-06-27 MED ORDER — HYDROMORPHONE HCL 1 MG/ML IJ SOLN
0.2500 mg | INTRAMUSCULAR | Status: DC | PRN
  Administered 2014-06-27 (×4): 0.5 mg via INTRAVENOUS

## 2014-06-27 MED ORDER — SIMETHICONE 80 MG PO CHEW
80.0000 mg | CHEWABLE_TABLET | Freq: Four times a day (QID) | ORAL | Status: DC | PRN
  Administered 2014-06-29: 80 mg via ORAL
  Filled 2014-06-27: qty 1

## 2014-06-27 MED ORDER — HYDROMORPHONE HCL 1 MG/ML IJ SOLN
INTRAMUSCULAR | Status: AC
  Administered 2014-06-27: 0.5 mg via INTRAVENOUS
  Filled 2014-06-27: qty 1

## 2014-06-27 MED ORDER — LACTATED RINGERS IV SOLN
INTRAVENOUS | Status: DC
  Administered 2014-06-27 (×3): via INTRAVENOUS

## 2014-06-27 MED ORDER — DEXTROSE IN LACTATED RINGERS 5 % IV SOLN: INTRAVENOUS | Status: DC

## 2014-06-27 MED ORDER — CEFAZOLIN SODIUM-DEXTROSE 2-3 GM-% IV SOLR
INTRAVENOUS | Status: AC
  Filled 2014-06-27: qty 50

## 2014-06-27 MED ORDER — PROMETHAZINE HCL 25 MG/ML IJ SOLN: 6.2500 mg | INTRAMUSCULAR | Status: DC | PRN

## 2014-06-27 MED ORDER — SCOPOLAMINE 1 MG/3DAYS TD PT72
1.0000 | MEDICATED_PATCH | Freq: Once | TRANSDERMAL | Status: DC
  Administered 2014-06-27: 1.5 mg via TRANSDERMAL

## 2014-06-27 MED ORDER — DEXTROSE IN LACTATED RINGERS 5 % IV SOLN
INTRAVENOUS | Status: DC
  Administered 2014-06-27 – 2014-06-28 (×2): via INTRAVENOUS

## 2014-06-27 MED ORDER — FENTANYL CITRATE 0.05 MG/ML IJ SOLN
INTRAMUSCULAR | Status: AC
  Filled 2014-06-27: qty 5

## 2014-06-27 MED ORDER — LIDOCAINE HCL (CARDIAC) 20 MG/ML IV SOLN
INTRAVENOUS | Status: AC
  Filled 2014-06-27: qty 5

## 2014-06-27 MED ORDER — PHENYLEPHRINE HCL 10 MG/ML IJ SOLN
INTRAMUSCULAR | Status: DC | PRN
  Administered 2014-06-27: 80 ug via INTRAVENOUS
  Administered 2014-06-27: 120 ug via INTRAVENOUS

## 2014-06-27 MED ORDER — KETOROLAC TROMETHAMINE 30 MG/ML IJ SOLN
INTRAMUSCULAR | Status: AC
  Filled 2014-06-27: qty 1

## 2014-06-27 MED ORDER — MIDAZOLAM HCL 2 MG/2ML IJ SOLN
INTRAMUSCULAR | Status: DC | PRN
  Administered 2014-06-27 (×2): 1 mg via INTRAVENOUS

## 2014-06-27 MED ORDER — PNEUMOCOCCAL VAC POLYVALENT 25 MCG/0.5ML IJ INJ
0.5000 mL | INJECTION | INTRAMUSCULAR | Status: DC
  Filled 2014-06-27: qty 0.5

## 2014-06-27 MED ORDER — DOCUSATE SODIUM 100 MG PO CAPS
100.0000 mg | ORAL_CAPSULE | Freq: Two times a day (BID) | ORAL | Status: DC
  Administered 2014-06-28 – 2014-06-29 (×3): 100 mg via ORAL
  Filled 2014-06-27 (×3): qty 1

## 2014-06-27 MED ORDER — FENTANYL CITRATE 0.05 MG/ML IJ SOLN
INTRAMUSCULAR | Status: DC | PRN
  Administered 2014-06-27: 50 ug via INTRAVENOUS
  Administered 2014-06-27: 100 ug via INTRAVENOUS
  Administered 2014-06-27 (×4): 50 ug via INTRAVENOUS

## 2014-06-27 MED ORDER — MEPERIDINE HCL 25 MG/ML IJ SOLN: 6.2500 mg | INTRAMUSCULAR | Status: DC | PRN

## 2014-06-27 MED ORDER — METOCLOPRAMIDE HCL 5 MG/ML IJ SOLN
10.0000 mg | Freq: Four times a day (QID) | INTRAMUSCULAR | Status: DC
  Administered 2014-06-27 – 2014-06-28 (×3): 10 mg via INTRAVENOUS
  Filled 2014-06-27 (×6): qty 2

## 2014-06-27 MED ORDER — DEXAMETHASONE SODIUM PHOSPHATE 4 MG/ML IJ SOLN
INTRAMUSCULAR | Status: AC
  Filled 2014-06-27: qty 1

## 2014-06-27 MED ORDER — ROCURONIUM BROMIDE 100 MG/10ML IV SOLN
INTRAVENOUS | Status: DC | PRN
  Administered 2014-06-27: 50 mg via INTRAVENOUS

## 2014-06-27 MED ORDER — KETOROLAC TROMETHAMINE 30 MG/ML IJ SOLN
30.0000 mg | Freq: Four times a day (QID) | INTRAMUSCULAR | Status: DC
  Administered 2014-06-27 – 2014-06-28 (×2): 30 mg via INTRAVENOUS
  Filled 2014-06-27 (×4): qty 1

## 2014-06-27 MED ORDER — PROPOFOL 10 MG/ML IV EMUL
INTRAVENOUS | Status: AC
  Filled 2014-06-27: qty 20

## 2014-06-27 MED ORDER — LIDOCAINE HCL (CARDIAC) 20 MG/ML IV SOLN
INTRAVENOUS | Status: DC | PRN
  Administered 2014-06-27: 30 mg via INTRAVENOUS
  Administered 2014-06-27: 70 mg via INTRAVENOUS

## 2014-06-27 MED ORDER — ONDANSETRON HCL 4 MG PO TABS
4.0000 mg | ORAL_TABLET | Freq: Four times a day (QID) | ORAL | Status: DC | PRN
  Administered 2014-06-29: 4 mg via ORAL
  Filled 2014-06-27: qty 1

## 2014-06-27 MED ORDER — MENTHOL 3 MG MT LOZG: 1.0000 | LOZENGE | OROMUCOSAL | Status: DC | PRN

## 2014-06-27 MED ORDER — GLYCOPYRROLATE 0.2 MG/ML IJ SOLN
INTRAMUSCULAR | Status: AC
  Filled 2014-06-27: qty 3

## 2014-06-27 MED ORDER — ONDANSETRON HCL 4 MG/2ML IJ SOLN
INTRAMUSCULAR | Status: DC | PRN
  Administered 2014-06-27: 4 mg via INTRAVENOUS

## 2014-06-27 MED ORDER — ONDANSETRON HCL 4 MG/2ML IJ SOLN
4.0000 mg | Freq: Four times a day (QID) | INTRAMUSCULAR | Status: DC | PRN
  Administered 2014-06-27 (×2): 4 mg via INTRAVENOUS
  Filled 2014-06-27 (×2): qty 2

## 2014-06-27 MED ORDER — FENTANYL CITRATE 0.05 MG/ML IJ SOLN
INTRAMUSCULAR | Status: AC
  Filled 2014-06-27: qty 2

## 2014-06-27 MED ORDER — ALUM & MAG HYDROXIDE-SIMETH 200-200-20 MG/5ML PO SUSP: 30.0000 mL | ORAL | Status: DC | PRN

## 2014-06-27 MED ORDER — HYDROMORPHONE HCL 1 MG/ML IJ SOLN
INTRAMUSCULAR | Status: AC
  Filled 2014-06-27: qty 1

## 2014-06-27 MED ORDER — OXYCODONE-ACETAMINOPHEN 5-325 MG PO TABS
1.0000 | ORAL_TABLET | ORAL | Status: DC | PRN
  Administered 2014-06-28: 1 via ORAL
  Administered 2014-06-28: 2 via ORAL
  Administered 2014-06-28 – 2014-06-29 (×2): 1 via ORAL
  Filled 2014-06-27 (×3): qty 1
  Filled 2014-06-27: qty 2

## 2014-06-27 SURGICAL SUPPLY — 41 items
BENZOIN TINCTURE PRP APPL 2/3 (GAUZE/BANDAGES/DRESSINGS) ×2 IMPLANT
CANISTER SUCT 3000ML (MISCELLANEOUS) ×2 IMPLANT
CLOTH BEACON ORANGE TIMEOUT ST (SAFETY) ×2 IMPLANT
CONT PATH 16OZ SNAP LID 3702 (MISCELLANEOUS) ×2 IMPLANT
DECANTER SPIKE VIAL GLASS SM (MISCELLANEOUS) IMPLANT
DRAPE WARM FLUID 44X44 (DRAPE) IMPLANT
DRSG OPSITE POSTOP 4X10 (GAUZE/BANDAGES/DRESSINGS) ×2 IMPLANT
DURAPREP 26ML APPLICATOR (WOUND CARE) ×2 IMPLANT
FORCEPS CUTTING 33CM 5MM (CUTTING FORCEPS) IMPLANT
GLOVE BIO SURGEON STRL SZ 6 (GLOVE) ×2 IMPLANT
GLOVE BIOGEL PI IND STRL 6 (GLOVE) ×2 IMPLANT
GLOVE BIOGEL PI IND STRL 7.0 (GLOVE) ×2 IMPLANT
GLOVE BIOGEL PI INDICATOR 6 (GLOVE) ×2
GLOVE BIOGEL PI INDICATOR 7.0 (GLOVE) ×2
GOWN STRL REUS W/TWL LRG LVL3 (GOWN DISPOSABLE) ×6 IMPLANT
HEMOSTAT SURGICEL 4X8 (HEMOSTASIS) IMPLANT
NEEDLE HYPO 25X1 1.5 SAFETY (NEEDLE) IMPLANT
NS IRRIG 1000ML POUR BTL (IV SOLUTION) IMPLANT
PACK ABDOMINAL GYN (CUSTOM PROCEDURE TRAY) ×2 IMPLANT
PAD ABD 7.5X8 STRL (GAUZE/BANDAGES/DRESSINGS) ×4 IMPLANT
PAD OB MATERNITY 4.3X12.25 (PERSONAL CARE ITEMS) ×2 IMPLANT
PROTECTOR NERVE ULNAR (MISCELLANEOUS) ×2 IMPLANT
SPONGE GAUZE 4X4 12PLY STER LF (GAUZE/BANDAGES/DRESSINGS) ×4 IMPLANT
SPONGE LAP 18X18 X RAY DECT (DISPOSABLE) ×4 IMPLANT
SPONGE SURGIFOAM ABS GEL 12-7 (HEMOSTASIS) ×2 IMPLANT
STAPLER VISISTAT 35W (STAPLE) IMPLANT
SUT CHROMIC 2 0 TIES 18 (SUTURE) IMPLANT
SUT MNCRL 0 MO-4 VIOLET 18 CR (SUTURE) ×4 IMPLANT
SUT MNCRL 0 VIOLET 6X18 (SUTURE) ×1 IMPLANT
SUT MON AB 2-0 CT1 36 (SUTURE) IMPLANT
SUT MON AB-0 CT1 36 (SUTURE) ×4 IMPLANT
SUT MONOCRYL 0 6X18 (SUTURE) ×1
SUT MONOCRYL 0 MO 4 18  CR/8 (SUTURE) ×4
SUT PDS AB 0 CTX 60 (SUTURE) ×2 IMPLANT
SUT PLAIN 2 0 XLH (SUTURE) ×2 IMPLANT
SUT VIC AB 3-0 X1 27 (SUTURE) IMPLANT
SUT VIC AB 4-0 KS 27 (SUTURE) ×2 IMPLANT
SYR CONTROL 10ML LL (SYRINGE) IMPLANT
TOWEL OR 17X24 6PK STRL BLUE (TOWEL DISPOSABLE) ×4 IMPLANT
TRAY FOLEY CATH 14FR (SET/KITS/TRAYS/PACK) ×2 IMPLANT
WATER STERILE IRR 1000ML POUR (IV SOLUTION) ×2 IMPLANT

## 2014-06-27 NOTE — Progress Notes (Signed)
-  NOS- Mild nausea.  Pain well-controlled.  Foley in.  No CP/SOB.  Tolerating clears.  VSS. AF. UOP adequate, clear yellow  Abd: soft, pressure dressing in place Ext: no c/c/e  58yo POD#0 s/p TAH, BSO -Continue pain and nausea control -Advance diet as tolerated. -D/C foley in AM -Saline lock IVF in AM  Linda Hedges, DO

## 2014-06-27 NOTE — Op Note (Signed)
Isabella Ramirez PROCEDURE DATE: 06/27/2014  PREOPERATIVE DIAGNOSIS:  Symptomatic fibroids POSTOPERATIVE DIAGNOSIS:  Symptomatic fibroids SURGEON:   Dr. Linda Hedges ASSISTANT:   Dr. Joycelyn Schmid OPERATION:  Total abdominal hysterectomy, bilateral salpingo-oophorectomy ANESTHESIA:  General endotracheal.  INDICATIONS: The patient is a 58 y.o. G2P2 with history of symptomatic uterine fibroids. The patient made a decision to undergo definite surgical treatment. On the preoperative visit, the risks, benefits, indications, and alternatives of the procedure were reviewed with the patient.  On the day of surgery, the risks of surgery were again discussed with the patient including but not limited to: bleeding which may require transfusion or reoperation; infection which may require antibiotics; injury to bowel, bladder, ureters or other surrounding organs; need for additional procedures; thromboembolic phenomenon, incisional problems and other postoperative/anesthesia complications. Written informed consent was obtained.    OPERATIVE FINDINGS: A 20 week size uterus with normal tubes and ovaries bilaterally.  ESTIMATED BLOOD LOSS: 300 ml SPECIMENS:  Uterus, fallopian tubes, and ovaries sent to pathology (9629 gm) COMPLICATIONS:  None immediate.   DESCRIPTION OF PROCEDURE: The patient received intravenous antibiotics and had sequential compression devices applied to her lower extremities while in the preoperative area.   She was taken to the operating room and placed under general anesthesia without difficulty and found to be adequate.The abdomen and perineum were prepped and draped in a sterile manner, and a Foley catheter was inserted into the bladder and attached to constant drainage. After an adequate timeout was performed, a Pfannensteil skin incision was made. This incision was taken down to the fascia using electrocautery with care given to maintain good hemostasis. The fascia was incised in the  midline and the fascial incision was then extended bilaterally using electrocautery without difficulty. The fascia was then dissected off the underlying rectus muscles using blunt and sharp dissection. The rectus muscles were split bluntly in the midline and the peritoneum entered sharply without complication. This peritoneal incision was then extended superiorly and inferiorly with care given to prevent bowel or bladder injury. Upon entry into the abdominal cavity, the upper abdomen was inspected and found to be normal. Attention was then turned to the pelvis. A retractor was placed into the incision, and the bowel was packed away with moist laparotomy sponges. The uterus at this point was noted to be mobilized. The round ligaments on each side were clamped, suture ligated, and transected with electrocautery allowing entry into the broad ligament. The anterior and posterior leaves of the broad ligament were separated and the ureters were inspected to be safely away from the area of dissection bilaterally. A hole was created in the clear portion of the posterior broad ligament and the infundibulopelvic ligament clamped on the patient's right side. This pedicle was then clamped, cut, and doubly suture ligated with good hemostasis.  This procedure was repeated in an identical fashion on the opposite side, incorporating both ovaries and tubes in the pathology specimen, and allowing salpingoophorectomy.   A bladder flap was then created across the anterior leaf of the broad ligament and the bladder was then bluntly dissected off the lower uterine segment and cervix with good hemostasis. The uterine arteries were then skeletonized bilaterally and then clamped, cut, and ligated with care given to prevent ureteral injury. The uterosacral ligaments were then clamped, cut, and ligated bilaterally.  The uterus was then amputated across the lower uterine segment to allow better visibility for completion of the case. Finally,  the cardinal ligaments were clamped, cut, and ligated bilaterally.  Acutely curved clamps were placed across the vagina just under the cervix, and the specimen was amputated and sent to pathology with the uterine fundus. The vaginal cuff was closed with a series of interrupted 0 Vicryl figure-of-eight sutures with care given to incorporate the anterior pubocervical fascia and the posterior rectovaginal fascia.  The vaginal cuff angles were noted to have good hemostasis.  The pelvis was irrigated and hemostasis was reconfirmed at all pedicles and along the pelvic sidewall.  A piece of gelfoam was placed on the left ovarian fossa to encourage continue hemostasis after peritoneal edges were rendered hemostatic using Bovie cautery.  All laparotomy sponges and instruments were removed from the abdomen. The peritoneum was closed with 2-0 Vicryl, and the fascia was closed with 0 Vicryl in a running fashion. The subcutaneous layer was reapproximated with 2-0 plain gut. The skin was closed with a 4-0 Monocryl subcuticular stitch. Sponge, lap, needle, and instrument counts were correct times two. The patient was taken to the recovery area awake, extubated and in stable condition.

## 2014-06-27 NOTE — Anesthesia Preprocedure Evaluation (Signed)
Anesthesia Evaluation  Patient identified by MRN, date of birth, ID band Patient awake    Reviewed: Allergy & Precautions, H&P , NPO status , Patient's Chart, lab work & pertinent test results  Airway Mallampati: III TM Distance: >3 FB Neck ROM: Full    Dental no notable dental hx. (+) Teeth Intact   Pulmonary asthma ,  breath sounds clear to auscultation  Pulmonary exam normal       Cardiovascular negative cardio ROS  Rhythm:Regular Rate:Normal     Neuro/Psych PSYCHIATRIC DISORDERS negative neurological ROS     GI/Hepatic negative GI ROS, Neg liver ROS,   Endo/Other  Morbid obesity  Renal/GU negative Renal ROS  negative genitourinary   Musculoskeletal negative musculoskeletal ROS (+)   Abdominal (+) + obese,   Peds  Hematology  (+) anemia ,   Anesthesia Other Findings   Reproductive/Obstetrics Fibroid uterus                           Anesthesia Physical Anesthesia Plan  ASA: III  Anesthesia Plan: General   Post-op Pain Management:    Induction: Intravenous  Airway Management Planned: Oral ETT  Additional Equipment:   Intra-op Plan:   Post-operative Plan: Extubation in OR  Informed Consent: I have reviewed the patients History and Physical, chart, labs and discussed the procedure including the risks, benefits and alternatives for the proposed anesthesia with the patient or authorized representative who has indicated his/her understanding and acceptance.   Dental advisory given  Plan Discussed with: Anesthesiologist, CRNA and Surgeon  Anesthesia Plan Comments:         Anesthesia Quick Evaluation

## 2014-06-27 NOTE — Anesthesia Postprocedure Evaluation (Signed)
Anesthesia Post Note  Patient: Isabella Ramirez  Procedure(s) Performed: Procedure(s) (LRB): HYSTERECTOMY ABDOMINAL WITH BILATERAL SALPINGO OOPHORECTOMY (N/A)  Anesthesia type: General  Patient location: PACU  Post pain: Pain level controlled  Post assessment: Post-op Vital signs reviewed  Last Vitals:  Filed Vitals:   06/27/14 0945  BP: 123/53  Pulse: 75  Temp:   Resp: 15    Post vital signs: Reviewed  Level of consciousness: sedated  Complications: No apparent anesthesia complications

## 2014-06-27 NOTE — Progress Notes (Signed)
No change to H&P. 

## 2014-06-27 NOTE — Anesthesia Postprocedure Evaluation (Signed)
  Anesthesia Post-op Note  Patient: Isabella Ramirez  Procedure(s) Performed: Procedure(s): HYSTERECTOMY ABDOMINAL WITH BILATERAL SALPINGO OOPHORECTOMY (N/A)  Patient Location: Women's Unit  Anesthesia Type:General  Level of Consciousness: awake, alert , oriented and patient cooperative  Airway and Oxygen Therapy: Patient Spontanous Breathing  Post-op Pain: mild  Post-op Assessment: Post-op Vital signs reviewed, Patient's Cardiovascular Status Stable, Respiratory Function Stable and Patent Airway  Post-op Vital Signs: Reviewed and stable  Last Vitals:  Filed Vitals:   06/27/14 1130  BP: 122/69  Pulse: 70  Temp: 36.5 C  Resp: 12    Complications: No apparent anesthesia complications

## 2014-06-27 NOTE — Addendum Note (Signed)
Addendum created 06/27/14 1028 by Tobin Chad, CRNA   Modules edited: Anesthesia Flowsheet, Anesthesia LDA

## 2014-06-27 NOTE — Addendum Note (Signed)
Addendum created 06/27/14 1402 by Raenette Rover, CRNA   Modules edited: Notes Section   Notes Section:  File: [id number]

## 2014-06-27 NOTE — Transfer of Care (Signed)
Immediate Anesthesia Transfer of Care Note  Patient: Isabella Ramirez  Procedure(s) Performed: Procedure(s): HYSTERECTOMY ABDOMINAL WITH BILATERAL SALPINGO OOPHORECTOMY (N/A)  Patient Location: PACU  Anesthesia Type:General  Level of Consciousness: awake, sedated and patient cooperative  Airway & Oxygen Therapy: Patient Spontanous Breathing and Patient connected to nasal cannula oxygen  Post-op Assessment: Report given to PACU RN and Post -op Vital signs reviewed and stable  Post vital signs: Reviewed and stable  Complications: No apparent anesthesia complications

## 2014-06-28 ENCOUNTER — Encounter (HOSPITAL_COMMUNITY): Payer: Self-pay | Admitting: Obstetrics & Gynecology

## 2014-06-28 LAB — COMPREHENSIVE METABOLIC PANEL
ALT: 11 U/L (ref 0–35)
AST: 13 U/L (ref 0–37)
Albumin: 2.8 g/dL — ABNORMAL LOW (ref 3.5–5.2)
Alkaline Phosphatase: 64 U/L (ref 39–117)
Anion gap: 7 (ref 5–15)
BUN: 6 mg/dL (ref 6–23)
CO2: 30 mEq/L (ref 19–32)
Calcium: 8.5 mg/dL (ref 8.4–10.5)
Chloride: 100 mEq/L (ref 96–112)
Creatinine, Ser: 0.6 mg/dL (ref 0.50–1.10)
GFR calc Af Amer: >90 mL/min (ref >90)
GFR calc non Af Amer: >90 mL/min (ref >90)
Glucose, Bld: 119 mg/dL — ABNORMAL HIGH (ref 70–99)
Potassium: 4 mEq/L (ref 3.7–5.3)
Sodium: 137 mEq/L (ref 137–147)
Total Bilirubin: 0.3 mg/dL (ref 0.3–1.2)
Total Protein: 5.5 g/dL — ABNORMAL LOW (ref 6.0–8.3)

## 2014-06-28 LAB — CBC
HCT: 34.4 % — ABNORMAL LOW (ref 36.0–46.0)
Hemoglobin: 11.3 g/dL — ABNORMAL LOW (ref 12.0–15.0)
MCH: 29.3 pg (ref 26.0–34.0)
MCHC: 32.8 g/dL (ref 30.0–36.0)
MCV: 89.1 fL (ref 78.0–100.0)
Platelets: 263 10*3/uL (ref 150–400)
RBC: 3.86 MIL/uL — ABNORMAL LOW (ref 3.87–5.11)
RDW: 12.8 % (ref 11.5–15.5)
WBC: 9 10*3/uL (ref 4.0–10.5)

## 2014-06-28 NOTE — Progress Notes (Signed)
No current c/o.  Nausea improved.  Pain well controlled.  Ambulating in hallway.  No flatus.  Tolerating clears.  Voiding without difficulty.  Labs reviewed and unremarkable  VSS.  AF. Gen: A&O x 3 Abd: soft, ND, pressure dressing removed.  Honeycomb in place with scant old blood Ext: no c/c/e, SCDs on.  58yo POD#1 s/p TAH, BSO -Continue pain and nausea control -Advance diet -Ambulation -Plan to d/c home tomorrow

## 2014-06-29 MED ORDER — OXYCODONE-ACETAMINOPHEN 5-325 MG PO TABS: 1.0000 | ORAL_TABLET | ORAL | Status: DC | PRN

## 2014-06-29 MED ORDER — BISACODYL 10 MG RE SUPP
10.0000 mg | Freq: Every day | RECTAL | Status: DC | PRN
  Administered 2014-06-29: 10 mg via RECTAL
  Filled 2014-06-29: qty 1

## 2014-06-29 MED ORDER — IBUPROFEN 600 MG PO TABS: 600.0000 mg | ORAL_TABLET | Freq: Four times a day (QID) | ORAL | Status: DC | PRN

## 2014-06-29 NOTE — Progress Notes (Signed)
Discharge teaching complete. Pt understood instructions and all questions were answered. Pt ambulated out of the hospital and discharged home to family,

## 2014-06-29 NOTE — Progress Notes (Signed)
Feeling well.  Tolerating po.  Ambulating well.  No N/V.  Pain well-controlled.  Voiding without difficulty.  No flatus.  VSS.  AF. Gen: A&O x 2 Abd: soft, ND, honeycomb in place Ext: no c/c/e  58yo POD#2 s/p TAH, BSO -Meeting all goals -D/C home today

## 2014-06-29 NOTE — Discharge Instructions (Signed)
Call MD for T>100.4, vaginal bleeding, severe abdominal pain, intractable nausea and/or vomiting.  Call office to schedule postop appointment in 2 weeks.  No driving while taking narcotics.  Pelvic rest x 6 weeks.  No heavy lifting x 4 weeks.

## 2014-06-29 NOTE — Discharge Summary (Signed)
Physician Discharge Summary  Patient ID: Isabella Ramirez MRN: [id number] DOB/AGE: 56/23/1957 56 y.o.  Admit date: 06/27/2014 Discharge date: 06/29/2014  Admission Diagnoses: 58yo with symptomatic fibroid uterus  Discharge Diagnoses: SAA Active Problems:   S/P hysterectomy   Discharged Condition: good  Hospital Course: Patient was admitted to preop with plans for TAH, BSO.  This procedure was performed without complication.  The patient did well postoperatively and on POD#2 was meeting all goals.  She was discharged home with follow up in the office.  Consults: None  Significant Diagnostic Studies: none  Treatments: surgery: TAH, BSO  Discharge Exam: Blood pressure 128/64, pulse 98, temperature 99.2 F (37.3 C), temperature source Oral, resp. rate 18, height 5\' 2"  (1.575 m), weight 204 lb (92.534 kg), last menstrual period 12/07/2010, SpO2 98.00%. General appearance: alert, cooperative and appears stated age GI: soft, ND Extremities: extremities normal, atraumatic, no cyanosis or edema Incision/Wound: clean honeycomb dressing  Disposition: Final discharge disposition not confirmed     Medication List         BIOTIN FORTE PO  Take 1 tablet by mouth daily.     calcium gluconate 500 MG tablet  Take 500 mg by mouth daily.     cetirizine 10 MG tablet  Commonly known as:  ZYRTEC  Take 10 mg by mouth daily.     ferrous sulfate 325 (65 FE) MG tablet  Take 325 mg by mouth daily with breakfast.     ibuprofen 600 MG tablet  Commonly known as:  ADVIL,MOTRIN  Take 1 tablet (600 mg total) by mouth every 6 (six) hours as needed.     montelukast 10 MG tablet  Commonly known as:  SINGULAIR  Take 1 tablet (10 mg total) by mouth daily.     multivitamin capsule  Take 1 capsule by mouth daily.     oxyCODONE-acetaminophen 5-325 MG per tablet  Commonly known as:  PERCOCET/ROXICET  Take 1-2 tablets by mouth every 4 (four) hours as needed (moderate to severe pain (when  tolerating fluids)).     vitamin E 400 UNIT capsule  Take 400 Units by mouth daily.         SignedLinda Hedges 06/29/2014, 7:28 AM

## 2014-10-10 ENCOUNTER — Encounter: Payer: Self-pay | Admitting: Family Medicine

## 2014-10-10 ENCOUNTER — Ambulatory Visit (INDEPENDENT_AMBULATORY_CARE_PROVIDER_SITE_OTHER): Payer: BC Managed Care – PPO | Admitting: Family Medicine

## 2014-10-10 VITALS — BP 124/78 | HR 99 | Temp 98.7°F | Ht 62.0 in | Wt 199.1 lb

## 2014-10-10 DIAGNOSIS — R739 Hyperglycemia, unspecified: Secondary | ICD-10-CM

## 2014-10-10 DIAGNOSIS — Z0000 Encounter for general adult medical examination without abnormal findings: Secondary | ICD-10-CM

## 2014-10-10 DIAGNOSIS — R7303 Prediabetes: Secondary | ICD-10-CM | POA: Insufficient documentation

## 2014-10-10 LAB — COMPREHENSIVE METABOLIC PANEL
ALT: 14 U/L (ref 0–35)
AST: 14 U/L (ref 0–37)
Albumin: 4.1 g/dL (ref 3.5–5.2)
Alkaline Phosphatase: 83 U/L (ref 39–117)
BUN: 11 mg/dL (ref 6–23)
CO2: 31 mEq/L (ref 19–32)
Calcium: 9.4 mg/dL (ref 8.4–10.5)
Chloride: 105 mEq/L (ref 96–112)
Creatinine, Ser: 0.71 mg/dL (ref 0.40–1.20)
GFR: 109.09 mL/min (ref >60.00)
Glucose, Bld: 92 mg/dL (ref 70–99)
Potassium: 3.9 mEq/L (ref 3.5–5.1)
Sodium: 141 mEq/L (ref 135–145)
Total Bilirubin: 0.3 mg/dL (ref 0.2–1.2)
Total Protein: 7.2 g/dL (ref 6.0–8.3)

## 2014-10-10 LAB — CBC WITH DIFFERENTIAL/PLATELET
Basophils Absolute: 0 10*3/uL (ref 0.0–0.1)
Basophils Relative: 0.8 % (ref 0.0–3.0)
Eosinophils Absolute: 0.2 10*3/uL (ref 0.0–0.7)
Eosinophils Relative: 4.2 % (ref 0.0–5.0)
HCT: 37.7 % (ref 36.0–46.0)
Hemoglobin: 12.7 g/dL (ref 12.0–15.0)
Lymphocytes Relative: 32 % (ref 12.0–46.0)
Lymphs Abs: 1.3 10*3/uL (ref 0.7–4.0)
MCHC: 33.7 g/dL (ref 30.0–36.0)
MCV: 87.3 fl (ref 78.0–100.0)
Monocytes Absolute: 0.3 10*3/uL (ref 0.1–1.0)
Monocytes Relative: 8 % (ref 3.0–12.0)
Neutro Abs: 2.3 10*3/uL (ref 1.4–7.7)
Neutrophils Relative %: 55 % (ref 43.0–77.0)
Platelets: 385 10*3/uL (ref 150.0–400.0)
RBC: 4.32 Mil/uL (ref 3.87–5.11)
RDW: 13.6 % (ref 11.5–15.5)
WBC: 4.2 10*3/uL (ref 4.0–10.5)

## 2014-10-10 LAB — HEMOGLOBIN A1C: Hgb A1c MFr Bld: 6.1 % (ref 4.6–6.5)

## 2014-10-10 LAB — LIPID PANEL
Cholesterol: 182 mg/dL (ref 0–200)
HDL: 71.2 mg/dL (ref >39.00)
LDL Cholesterol: 97 mg/dL (ref 0–99)
NonHDL: 110.8
Total CHOL/HDL Ratio: 3
Triglycerides: 68 mg/dL (ref 0.0–149.0)
VLDL: 13.6 mg/dL (ref 0.0–40.0)

## 2014-10-10 LAB — TSH: TSH: 1.77 u[IU]/mL (ref 0.35–4.50)

## 2014-10-10 MED ORDER — MONTELUKAST SODIUM 10 MG PO TABS: 10.0000 mg | ORAL_TABLET | Freq: Every day | ORAL | Status: DC

## 2014-10-10 NOTE — Assessment & Plan Note (Signed)
Reviewed health habits including diet and exercise and skin cancer prevention Reviewed appropriate screening tests for age  Also reviewed health mt list, fam hx and immunization status , as well as social and family history   Wellness labs today Pt will schedule her own mammogram

## 2014-10-10 NOTE — Progress Notes (Signed)
Pre visit review using our clinic review tool, if applicable. No additional management support is needed unless otherwise documented below in the visit note. 

## 2014-10-10 NOTE — Assessment & Plan Note (Signed)
Check A1C today  Last visit screening glucose was slt elevated in the one teens  Disc low glycemic diet

## 2014-10-10 NOTE — Patient Instructions (Signed)
Labs today  Don't forget to schedule your mammogram  Take care of yourself  Eat a low fat/ low sugar diet and start exercising
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
## 2014-10-11 ENCOUNTER — Encounter: Payer: Self-pay | Admitting: *Deleted

## 2014-10-11 ENCOUNTER — Telehealth: Payer: Self-pay

## 2014-10-11 NOTE — Telephone Encounter (Signed)
I have spoken to patient this morning regarding some results. I have already written a letter for her. It was left in front for her to pick up.

## 2014-10-11 NOTE — Telephone Encounter (Signed)
Yes- leave her a message to call back with that info and I will write note - I think she took the day of the appt off completely but please verify that  Thanks

## 2014-10-11 NOTE — Telephone Encounter (Signed)
Pt left v/m requesting note for work; pt was seen on 10/10/14; unable to reach pt to see what date or dates are needed for work note.

## 2015-02-17 ENCOUNTER — Telehealth: Payer: Self-pay | Admitting: *Deleted

## 2015-02-17 NOTE — Telephone Encounter (Signed)
Spoke with patient concerning mammogram scheduling.  Patient agrees to schedule and phone number provided to [employer] [facility].

## 2015-06-21 ENCOUNTER — Encounter: Payer: Self-pay | Admitting: Gastroenterology

## 2015-08-14 ENCOUNTER — Encounter (INDEPENDENT_AMBULATORY_CARE_PROVIDER_SITE_OTHER): Payer: Self-pay

## 2015-08-14 ENCOUNTER — Ambulatory Visit (INDEPENDENT_AMBULATORY_CARE_PROVIDER_SITE_OTHER): Payer: BC Managed Care – PPO | Admitting: Family Medicine

## 2015-08-14 ENCOUNTER — Encounter: Payer: Self-pay | Admitting: Family Medicine

## 2015-08-14 VITALS — BP 118/78 | HR 94 | Temp 98.9°F | Ht 62.0 in | Wt 209.0 lb

## 2015-08-14 DIAGNOSIS — J0110 Acute frontal sinusitis, unspecified: Secondary | ICD-10-CM

## 2015-08-14 DIAGNOSIS — J019 Acute sinusitis, unspecified: Secondary | ICD-10-CM | POA: Insufficient documentation

## 2015-08-14 MED ORDER — AMOXICILLIN-POT CLAVULANATE 875-125 MG PO TABS: 1.0000 | ORAL_TABLET | Freq: Two times a day (BID) | ORAL | Status: DC

## 2015-08-14 NOTE — Assessment & Plan Note (Signed)
With viral uri  Cover with augmentin  Disc symptomatic care - see instructions on AVS  Update if not starting to improve in a week or if worsening

## 2015-08-14 NOTE — Progress Notes (Signed)
Pre visit review using our clinic review tool, if applicable. No additional management support is needed unless otherwise documented below in the visit note. 

## 2015-08-14 NOTE — Patient Instructions (Signed)
Drink lots of fluids and rest  Use nasal saline spray or a netti pot to decongest Breathe steam also  Warm compresses on face are helpful  mucinex over the counter for congestion  Ibuprofen as needed for headache or fever  Take augmentin for sinus infection as directed  Update if not starting to improve in a week or if worsening

## 2015-08-14 NOTE — Progress Notes (Signed)
Subjective:    Patient ID: Isabella Ramirez, female    DOB: Nov 16, 1955, 59 y.o.   MRN: [id number]  HPI Here with uri symptoms   Had a cold for a week Sat woke up worse - chills/achey- did not take her temp (suspect fever) Yesterday more sweating  Bad headache on R side of face - travels to the R side of her neck  Congestion has improved - yellow nasal drainage Coughs up brown phlegm  Not a lot of wheezing   OTC: nyquil, thera flu, ibuprofen (400 mg)- not too helpful for HA   Patient Active Problem List   Diagnosis Date Noted  . Hyperglycemia 10/10/2014  . S/P hysterectomy 06/27/2014  . Stress reaction 05/04/2014  . Routine general medical examination at a health care facility 02/15/2011  . Routine gynecological examination 02/15/2011  . Other screening mammogram 02/15/2011  . LEIOMYOMA OF UTERUS, UNSPECIFIED 08/29/2007  . UTERINE ENLARGEMENT 08/03/2007  . ALLERGIC RHINITIS 04/23/2007  . ASTHMA 04/23/2007   Past Medical History  Diagnosis Date  . Allergy     allergic rhinitis  . Asthma     as a child  . Uterine fibroids affecting pregnancy   . Ovarian cyst    Past Surgical History  Procedure Laterality Date  . Ramirez  01/2005  . Popliteal synovial cyst excision    . Tubal ligation    . Abdominal hysterectomy N/A 06/27/2014    Procedure: HYSTERECTOMY ABDOMINAL WITH BILATERAL SALPINGO OOPHORECTOMY;  Surgeon: Linda Hedges, DO;  Location: [facility];  Service: Gynecology;  Laterality: N/A;   Social History  Substance Use Topics  . Smoking status: Never Smoker   . Smokeless tobacco: Never Used  . Alcohol Use: 0.0 oz/week    0 Standard drinks or equivalent per week     Comment: occ wine   Family History  Problem Relation Age of Onset  . Heart disease Mother   . Hypertension Mother    Allergies  Allergen Reactions  . Shellfish Allergy Hives and Itching   Current Outpatient Prescriptions on File Prior to Visit  Medication Sig Dispense Refill  . BIOTIN FORTE PO Take  1 tablet by mouth daily.      . calcium gluconate 500 MG tablet Take 500 mg by mouth daily.      . cetirizine (ZYRTEC) 10 MG tablet Take 10 mg by mouth daily.      . ferrous sulfate 325 (65 FE) MG tablet Take 325 mg by mouth daily with breakfast.      . montelukast (SINGULAIR) 10 MG tablet Take 1 tablet (10 mg total) by mouth daily. 30 tablet 11  . Multiple Vitamin (MULTIVITAMIN) capsule Take 1 capsule by mouth daily.      . vitamin E 400 UNIT capsule Take 400 Units by mouth daily.       No current facility-administered medications on file prior to visit.      Review of Systems Review of Systems  Constitutional: Negative for , appetite change, and unexpected weight change.  ENT pos for cong and sinus pain and pressure on the R, neg for ST Eyes: Negative for pain and visual disturbance.  Respiratory: Negative for wheeze  and shortness of breath.   Cardiovascular: Negative for cp or palpitations    Gastrointestinal: Negative for nausea, diarrhea and constipation.  Genitourinary: Negative for urgency and frequency.  Skin: Negative for pallor or rash   Neurological: Negative for weakness, light-headedness, numbness and headaches.  Hematological: Negative for adenopathy. Does not  bruise/bleed easily.  Psychiatric/Behavioral: Negative for dysphoric mood. The patient is not nervous/anxious.       Objective:   Physical Exam  Constitutional: She appears well-developed and well-nourished. No distress.  obese and well appearing   HENT:  Head: Normocephalic and atraumatic.  Right Ear: External ear normal.  Left Ear: External ear normal.  Mouth/Throat: Oropharynx is clear and moist. No oropharyngeal exudate.  Nares are injected and congested  R ethmoid and frontal  sinus tenderness  Post nasal drip noted  Throat is clear   Eyes: Conjunctivae and EOM are normal. Pupils are equal, round, and reactive to light. Right eye exhibits no discharge. Left eye exhibits no discharge.  Neck: Normal  range of motion. Neck supple.  Cardiovascular: Normal rate and regular rhythm.   Pulmonary/Chest: Effort normal and breath sounds normal. No respiratory distress. She has no wheezes. She has no rales.  Good air exch  Some upper airway sounds   Lymphadenopathy:    She has no cervical adenopathy.  Neurological: She is alert. No cranial nerve deficit.  Skin: Skin is warm and dry. No rash noted.  Psychiatric: She has a normal mood and affect.          Assessment & Plan:   Problem List Items Addressed This Visit      Respiratory   Acute sinusitis - Primary    With viral uri  Cover with augmentin  Disc symptomatic care - see instructions on AVS  Update if not starting to improve in a week or if worsening        Relevant Medications   amoxicillin-clavulanate (AUGMENTIN) 875-125 MG tablet

## 2015-11-21 ENCOUNTER — Other Ambulatory Visit: Payer: Self-pay

## 2015-11-21 DIAGNOSIS — Z1231 Encounter for screening mammogram for malignant neoplasm of breast: Secondary | ICD-10-CM

## 2015-12-04 ENCOUNTER — Ambulatory Visit
Admission: RE | Admit: 2015-12-04 | Discharge: 2015-12-04 | Disposition: A | Discharge status: Home / Self Care | Payer: BC Managed Care – PPO | Source: Ambulatory Visit

## 2015-12-04 DIAGNOSIS — Z1231 Encounter for screening mammogram for malignant neoplasm of breast: Secondary | ICD-10-CM

## 2015-12-04 LAB — HM MAMMOGRAPHY: HM Mammogram: NORMAL

## 2015-12-06 ENCOUNTER — Encounter: Payer: Self-pay | Admitting: *Deleted

## 2016-04-09 ENCOUNTER — Encounter: Payer: Self-pay | Admitting: Family Medicine

## 2016-04-09 ENCOUNTER — Ambulatory Visit (INDEPENDENT_AMBULATORY_CARE_PROVIDER_SITE_OTHER): Payer: BC Managed Care – PPO | Admitting: Family Medicine

## 2016-04-09 VITALS — BP 122/86 | HR 76 | Temp 98.6°F | Wt 208.2 lb

## 2016-04-09 DIAGNOSIS — S39011A Strain of muscle, fascia and tendon of abdomen, initial encounter: Secondary | ICD-10-CM

## 2016-04-09 DIAGNOSIS — R1012 Left upper quadrant pain: Secondary | ICD-10-CM

## 2016-04-09 LAB — POC URINALSYSI DIPSTICK (AUTOMATED)
Bilirubin, UA: NEGATIVE
Blood, UA: NEGATIVE
Glucose, UA: NEGATIVE
Ketones, UA: NEGATIVE
Leukocytes, UA: NEGATIVE
Nitrite, UA: NEGATIVE
Protein, UA: NEGATIVE
Spec Grav, UA: >=1.03
Urobilinogen, UA: 2
pH, UA: 6

## 2016-04-09 MED ORDER — METHOCARBAMOL 500 MG PO TABS: 500.0000 mg | ORAL_TABLET | Freq: Three times a day (TID) | ORAL | 0 refills | Status: DC | PRN

## 2016-04-09 NOTE — Addendum Note (Signed)
Addended by: Royann Shivers A on: 04/09/2016 12:23 PM   Modules accepted: Orders

## 2016-04-09 NOTE — Progress Notes (Signed)
BP 122/86   Pulse 76   Temp 98.6 F (37 C) (Oral)   Wt 208 lb 4 oz (94.5 kg)   LMP 12/07/2010   BMI 38.09 kg/m    CC: L flank pain Subjective:    Patient ID: Isabella Ramirez, female    DOB: 25-Apr-1956, 60 y.o.   MRN: [id number]  HPI: Isabella Ramirez is a 60 y.o. female presenting on 04/09/2016 for Flank Pain (left; x 24 hours; feels like a spasm)   Pt endorses 1d h/o L flank spasming pain that is positional - worse with rolling over, getting out of car, etc. Has never had anything like this in the past. No h/o kidney stones. Hasn't tried anything for this other than GasX which didn't help.   Denies fevers, abd pain, nausea/vomiting, bowel changes, urinary changes or hematuria.   Denies inciting trauma/injury, no heavy lifting. Just some weed eating recently.  No med/supplement use over the past week.  Relevant past medical, surgical, family and social history reviewed and updated as indicated. Interim medical history since our last visit reviewed. Allergies and medications reviewed and updated. Current Outpatient Prescriptions on File Prior to Visit  Medication Sig  . BIOTIN FORTE PO Take 1 tablet by mouth daily.    . calcium gluconate 500 MG tablet Take 500 mg by mouth daily.    . cetirizine (ZYRTEC) 10 MG tablet Take 10 mg by mouth daily.    . Multiple Vitamin (MULTIVITAMIN) capsule Take 1 capsule by mouth daily.    . vitamin E 400 UNIT capsule Take 400 Units by mouth daily.    . ferrous sulfate 325 (65 FE) MG tablet Take 325 mg by mouth daily with breakfast.    . montelukast (SINGULAIR) 10 MG tablet Take 1 tablet (10 mg total) by mouth daily. (Patient not taking: Reported on 04/09/2016)   No current facility-administered medications on file prior to visit.     Review of Systems Per HPI unless specifically indicated in ROS section     Objective:    BP 122/86   Pulse 76   Temp 98.6 F (37 C) (Oral)   Wt 208 lb 4 oz (94.5 kg)   LMP 12/07/2010   BMI 38.09 kg/m   Wt  Readings from Last 3 Encounters:  04/09/16 208 lb 4 oz (94.5 kg)  08/14/15 209 lb (94.8 kg)  10/10/14 199 lb 1.9 oz (90.3 kg)    Physical Exam  Constitutional: She appears well-developed and well-nourished. No distress.  Abdominal: Soft. Bowel sounds are normal. She exhibits no distension and no mass. There is no tenderness. There is no rebound and no guarding.  Musculoskeletal: She exhibits no edema.  No midline spine tenderness No significant paraspinous mm tenderness Mild discomfort to palpation at lateral lower ribcage on left  Reproducible discomfort L flank with sitting up from supine position  Neurological: She is alert.  Skin: No rash noted.  No vesicular rash  Nursing note and vitals reviewed.  Results for orders placed or performed in visit on 08/14/15  [facility]  Result Value Ref Range   [facility] Mammogram normal       Assessment & Plan:   Problem List Items Addressed This Visit    Strain of abdominal wall    Anticipate abdominal wall strain given positional and mildly reproducible nature of complaint. Treat with heating pad and robaxin muscle relaxant. Update if not improving with treatment.  Check UA today to help r/o kidney stone.  Other Visit Diagnoses   None.      Follow up plan: Return if symptoms worsen or fail to improve.  Ria Bush, MD

## 2016-04-09 NOTE — Assessment & Plan Note (Signed)
Anticipate abdominal wall strain given positional and mildly reproducible nature of complaint. Treat with heating pad and robaxin muscle relaxant. Update if not improving with treatment.  Check UA today to help r/o kidney stone.

## 2016-04-09 NOTE — Progress Notes (Signed)
Pre visit review using our clinic review tool, if applicable. No additional management support is needed unless otherwise documented below in the visit note. 

## 2016-04-09 NOTE — Patient Instructions (Signed)
I think you have abdominal wall strain - treat with heating pad and robaxin muscle relaxant (caution it can make you sleepy) Let us know if not improving with treatment Urinalysis today.

## 2017-02-25 ENCOUNTER — Ambulatory Visit: Payer: Worker's Compensation | Admitting: Family Medicine

## 2017-02-25 ENCOUNTER — Encounter: Payer: Self-pay | Admitting: Family Medicine

## 2017-02-25 ENCOUNTER — Ambulatory Visit
Admission: RE | Admit: 2017-02-25 | Discharge: 2017-02-25 | Disposition: A | Discharge status: Home / Self Care | Payer: Worker's Compensation | Source: Ambulatory Visit | Attending: Family Medicine | Admitting: Family Medicine

## 2017-02-25 VITALS — BP 120/79 | HR 82 | Temp 98.0°F | Resp 16 | Ht 62.0 in | Wt 212.6 lb

## 2017-02-25 DIAGNOSIS — Y990 Civilian activity done for income or pay: Secondary | ICD-10-CM

## 2017-02-25 DIAGNOSIS — M545 Low back pain, unspecified: Secondary | ICD-10-CM

## 2017-02-25 DIAGNOSIS — M25572 Pain in left ankle and joints of left foot: Secondary | ICD-10-CM

## 2017-02-25 NOTE — Patient Instructions (Addendum)
[facility] Address: [address], [place], [place] [address]  Hours:  Open now   Add full hours  Phone: [phone]     IF you received an x-ray today, you will receive an invoice from [facility] Radiology. Please contact [facility] at [phone] with questions or concerns regarding your invoice.   IF you received labwork today, you will receive an invoice from [facility]. Please contact [facility] at [phone] with questions or concerns regarding your invoice.   Our billing staff will not be able to assist you with questions regarding bills from these companies.  You will be contacted with the lab results as soon as they are available. The fastest way to get your results is to activate your My Chart account. Instructions are located on the last page of this paperwork. If you have not heard from [place] regarding the results in 2 weeks, please contact this office.    We recommend that you schedule a mammogram for breast cancer screening. Typically, you do not need a referral to do this. Please contact a local imaging center to schedule your mammogram.  [facility] - [phone]  *ask for the Radiology Department [facility] - [phone] or [phone]  [facility] - [phone] [place] [phone] [facility] - [phone]  *ask for the [facility] - [phone]  *ask for the Radiology Department [facility] - [phone]  *ask for the Mammography Department [facility] - [phone] Ankle Pain Many things can cause ankle pain, including an injury to the area and overuse of the ankle.The ankle joint holds your body weight and allows you to move around. Ankle pain can occur on either side or the back of one ankle or both ankles. Ankle pain may be sharp and burning or dull and aching. There may be tenderness, stiffness,  redness, or warmth around the ankle. Follow these instructions at home: Activity  Rest your ankle as told by your health care provider. Avoid any activities that cause ankle pain.  Do exercises as told by your health care provider.  Ask your health care provider if you can drive. Using a brace, a bandage, or crutches  If you were given a brace: ? Wear it as told by your health care provider. ? Remove it when you take a bath or a shower. ? Try not to move your ankle very much, but wiggle your toes from time to time. This helps to prevent swelling.  If you were given an elastic bandage: ? Remove it when you take a bath or a shower. ? Try not to move your ankle very much, but wiggle your toes from time to time. This helps to prevent swelling. ? Adjust the bandage to make it more comfortable if it feels too tight. ? Loosen the bandage if you have numbness or tingling in your foot or if your foot turns cold and blue.  If you have crutches, use them as told by your health care provider. Continue to use them until you can walk without feeling pain in your ankle. Managing pain, stiffness, and swelling  Raise (elevate) your ankle above the level of your heart while you are sitting or lying down.  If directed, apply ice to the area: ? Put ice in a plastic bag. ? Place a towel between your skin and the bag. ? Leave the ice on for 20 minutes, 2-3 times  per day. General instructions  Keep all follow-up visits as told by your health care provider. This is important.  Record this information that may be helpful for you and your health care provider: ? How often you have ankle pain. ? Where the pain is located. ? What the pain feels like.  Take over-the-counter and prescription medicines only as told by your health care provider. Contact a health care provider if:  Your pain gets worse.  Your pain is not relieved with medicines.  You have a fever or chills.  You are having more  trouble with walking.  You have new symptoms. Get help right away if:  Your foot, leg, toes, or ankle tingles or becomes numb.  Your foot, leg, toes, or ankle becomes swollen.  Your foot, leg, toes, or ankle turns pale or blue. This information is not intended to replace advice given to you by your health care provider. Make sure you discuss any questions you have with your health care provider. Document Released: 02/13/2010 Document Revised: 04/26/2016 Document Reviewed: 03/28/2015 Elsevier Interactive Patient Education  2017 [employer].

## 2017-02-25 NOTE — Progress Notes (Signed)
Chief Complaint  Patient presents with  . Back Pain    onset: today while working  . 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 Allergy    allergic rhinitis  . Asthma    as a child  . Ovarian cyst   . Uterine fibroids affecting pregnancy     Current Outpatient Prescriptions  Medication Sig Dispense Refill  . BIOTIN FORTE PO Take 1 tablet by mouth daily.      . calcium gluconate 500 MG tablet Take 500 mg by mouth daily.      . cetirizine (ZYRTEC) 10 MG tablet Take 10 mg by mouth daily.      . ferrous sulfate 325 (65 FE) MG tablet Take 325 mg by mouth daily with breakfast.      . Multiple Vitamin (MULTIVITAMIN) capsule Take 1 capsule by mouth daily.      . vitamin E 400 UNIT capsule Take 400 Units by mouth daily.      . methocarbamol (ROBAXIN) 500 MG tablet Take 1 tablet (500 mg total) by mouth 3 (three) times daily as needed for muscle spasms (sedation precautions). (Patient not taking: Reported on 02/25/2017) 30 tablet 0  . montelukast (SINGULAIR) 10 MG tablet Take 1 tablet (10 mg total) by mouth daily. (Patient not taking: Reported on 04/09/2016) 30 tablet 11   No current facility-administered medications for this visit.     Allergies:  Allergies  Allergen Reactions  . Shellfish Allergy Hives and Itching    Past  Surgical History:  Procedure Laterality Date  . ABDOMINAL HYSTERECTOMY N/A 06/27/2014   Procedure: HYSTERECTOMY ABDOMINAL WITH BILATERAL SALPINGO OOPHORECTOMY;  Surgeon: Linda Hedges, DO;  Location: [facility];  Service: Gynecology;  Laterality: N/A;  . LEEP  01/2005  . POPLITEAL SYNOVIAL CYST EXCISION    . TUBAL LIGATION      Social History   Social History  . Marital status: Married    Spouse name: N/A  . Number of children: N/A  . Years of education: N/A   Social History Main Topics  . Smoking status: Never Smoker  . Smokeless tobacco: Never Used  . Alcohol use 0.0 oz/week     Comment: occ wine  . Drug use: No  . Sexual activity: Not Asked   Other Topics Concern  . None   Social History Narrative  . None    Review of Systems  Constitutional: Negative for chills and fever.  Gastrointestinal: Negative for abdominal pain, nausea and vomiting.  Musculoskeletal: Positive for back pain, falls and joint pain.  Skin: Negative for itching and rash.  Neurological: Negative for dizziness, tingling, tremors and headaches.    Objective: Vitals:   02/25/17 1213  BP: 120/79  Pulse: 82  Resp: 16  Temp: 98 F (36.7 C)  TempSrc: Oral  SpO2: 98%  Weight: 212 lb 9.6 oz (96.4 kg)  Height: 5\' 2"  (1.575 m)    Physical Exam  Constitutional: She is oriented to person, place, and time. She appears well-developed and well-nourished.  HENT:  Head: Normocephalic and atraumatic.  Eyes: Conjunctivae and EOM are normal.  Cardiovascular: Normal rate, regular rhythm and normal heart sounds.   Pulmonary/Chest: Effort normal and breath sounds normal. No respiratory distress. She has no wheezes.  Musculoskeletal: Normal range of motion.  Neurological: She is alert and oriented to person, place, and time.  Psychiatric: She has a normal mood and affect. Her behavior is normal. Judgment and thought content normal.     No visible abrasion on face or wrists Low back with tenderness from  L4-S1 No SI joint tenderness To tenderness over the hip Superficial abrasion of the left ankle    Assessment and Plan Aliyha was seen today for back pain and ankle pain.  Diagnoses and all orders for this visit:  Work related injury-  Pt may return to work tomorrow  -     DG Ankle Complete Left  Acute left ankle pain - advised ice, rest, elevation, nsaids prn -     DG Ankle Complete Left  Acute midline low back pain without sciatica-  Advised pt to try stretching nsaids No evidence of sciatica Light duty for one week     [place]

## 2017-07-01 ENCOUNTER — Encounter: Payer: Self-pay | Admitting: Family Medicine

## 2017-07-01 ENCOUNTER — Ambulatory Visit (INDEPENDENT_AMBULATORY_CARE_PROVIDER_SITE_OTHER): Payer: BC Managed Care – PPO | Admitting: Family Medicine

## 2017-07-01 VITALS — BP 124/68 | HR 87 | Temp 98.1°F | Ht 62.0 in | Wt 210.5 lb

## 2017-07-01 DIAGNOSIS — J209 Acute bronchitis, unspecified: Secondary | ICD-10-CM

## 2017-07-01 MED ORDER — GUAIFENESIN-CODEINE 100-10 MG/5ML PO SYRP: 5.0000 mL | ORAL_SOLUTION | Freq: Every evening | ORAL | 0 refills | Status: DC | PRN

## 2017-07-01 MED ORDER — ALBUTEROL SULFATE HFA 108 (90 BASE) MCG/ACT IN AERS: 2.0000 | INHALATION_SPRAY | RESPIRATORY_TRACT | 0 refills | Status: DC | PRN

## 2017-07-01 MED ORDER — BENZONATATE 200 MG PO CAPS: 200.0000 mg | ORAL_CAPSULE | Freq: Three times a day (TID) | ORAL | 1 refills | Status: DC | PRN

## 2017-07-01 MED ORDER — AZITHROMYCIN 250 MG PO TABS: ORAL_TABLET | ORAL | 0 refills | Status: DC

## 2017-07-01 NOTE — Patient Instructions (Signed)
I think you have bronchitis  Drink fluids and try to get extra rest  Use inhaler if you get tight or wheezy   Take the zpak  Take tessalon three times daily for cough as needed Codeine cough syrup at night (caution of sedation)    Update if not starting to improve in a week or if worsening

## 2017-07-01 NOTE — Assessment & Plan Note (Signed)
10 plus days-cover with zpak  Albuterol mdi prn  Tessalon tid cough  guiaf /codeine cough med at bedtime prn  Fluids/rest  Disc symptomatic care - see instructions on AVS  Update if not starting to improve in a week or if worsening

## 2017-07-01 NOTE — Progress Notes (Signed)
Subjective:    Patient ID: Isabella Ramirez, female    DOB: 1956-08-05, 61 y.o.   MRN: [id number]  HPI Here for uri symptoms incl cough and congestion  Over 10 days   Lots of chest congestion and pnd  Phlegm - yellow to brown  Achy all over  Did get some chills -early on    Did have a headache Some sinus pressure  Throat is scratchy  Feels like mucous in throat all the time  Nasal congestion  Tired   otc : thera flu Night and day cold med from the dollar store  Tussin cough med  Lemon honey tea   Drinking lots of fluids   Last night - just a little wheeze or rattle (chest was just a little tight) Does not have an inhaler    Temp: 98.1 F (36.7 C)    Wt Readings from Last 3 Encounters:  07/01/17 210 lb 8 oz (95.5 kg)  02/25/17 212 lb 9.6 oz (96.4 kg)  04/09/16 208 lb 4 oz (94.5 kg)    Pulse ox is 97% on RA today    Patient Active Problem List   Diagnosis Date Noted  . Acute bronchitis 07/01/2017  . Strain of abdominal wall 04/09/2016  . Hyperglycemia 10/10/2014  . S/P hysterectomy 06/27/2014  . Stress reaction 05/04/2014  . Routine general medical examination at a health care facility 02/15/2011  . Routine gynecological examination 02/15/2011  . Other screening mammogram 02/15/2011  . LEIOMYOMA OF UTERUS, UNSPECIFIED 08/29/2007  . UTERINE ENLARGEMENT 08/03/2007  . ALLERGIC RHINITIS 04/23/2007  . ASTHMA 04/23/2007   Past Medical History:  Diagnosis Date  . Allergy    allergic rhinitis  . Asthma    as a child  . Ovarian cyst   . Uterine fibroids affecting pregnancy    Past Surgical History:  Procedure Laterality Date  . ABDOMINAL HYSTERECTOMY N/A 06/27/2014   Procedure: HYSTERECTOMY ABDOMINAL WITH BILATERAL SALPINGO OOPHORECTOMY;  Surgeon: Linda Hedges, DO;  Location: [facility];  Service: Gynecology;  Laterality: N/A;  . Ramirez  01/2005  . POPLITEAL SYNOVIAL CYST EXCISION    . TUBAL LIGATION     Social History  Substance Use Topics  . Smoking  status: Never Smoker  . Smokeless tobacco: Never Used  . Alcohol use 0.0 oz/week     Comment: occ wine   Family History  Problem Relation Age of Onset  . Heart disease Mother   . Hypertension Mother    Allergies  Allergen Reactions  . Shellfish Allergy Hives and Itching   Current Outpatient Prescriptions on File Prior to Visit  Medication Sig Dispense Refill  . BIOTIN FORTE PO Take 1 tablet by mouth daily.      . calcium gluconate 500 MG tablet Take 500 mg by mouth daily.      . cetirizine (ZYRTEC) 10 MG tablet Take 10 mg by mouth daily.      . Multiple Vitamin (MULTIVITAMIN) capsule Take 1 capsule by mouth daily.      . vitamin E 400 UNIT capsule Take 400 Units by mouth daily.       No current facility-administered medications on file prior to visit.      Review of Systems  Constitutional: Positive for appetite change and fatigue. Negative for fever.  HENT: Positive for congestion, postnasal drip, rhinorrhea, sinus pressure, sneezing and sore throat. Negative for ear pain.   Eyes: Negative for pain and discharge.  Respiratory: Positive for cough. Negative for shortness of breath,  wheezing and stridor.   Cardiovascular: Negative for chest pain.  Gastrointestinal: Negative for diarrhea, nausea and vomiting.  Genitourinary: Negative for frequency, hematuria and urgency.  Musculoskeletal: Negative for arthralgias and myalgias.  Skin: Negative for rash.  Neurological: Positive for headaches. Negative for dizziness, weakness and light-headedness.  Psychiatric/Behavioral: Negative for confusion and dysphoric mood.       Objective:   Physical Exam  Constitutional: She appears well-developed and well-nourished. No distress.  HENT:  Head: Normocephalic and atraumatic.  Right Ear: External ear normal.  Left Ear: External ear normal.  Mouth/Throat: Oropharynx is clear and moist.  Nares are injected and congested  No sinus tenderness Clear rhinorrhea and post nasal drip   Eyes:  Pupils are equal, round, and reactive to light. Conjunctivae and EOM are normal. Right eye exhibits no discharge. Left eye exhibits no discharge.  Neck: Normal range of motion. Neck supple.  Cardiovascular: Normal rate and normal heart sounds.   Pulmonary/Chest: Effort normal and breath sounds normal. No respiratory distress. She has no wheezes. She has no rales. She exhibits no tenderness.  Harsh bs and few scattered rhonchi  No wheeze  Lymphadenopathy:    She has no cervical adenopathy.  Neurological: She is alert.  Skin: Skin is warm and dry. No rash noted.  Psychiatric: She has a normal mood and affect.          Assessment & Plan:   Problem List Items Addressed This Visit      Respiratory   Acute bronchitis    10 plus days-cover with zpak  Albuterol mdi prn  Tessalon tid cough  guiaf /codeine cough med at bedtime prn  Fluids/rest  Disc symptomatic care - see instructions on AVS  Update if not starting to improve in a week or if worsening

## 2017-08-17 ENCOUNTER — Telehealth: Payer: Self-pay | Admitting: Family Medicine

## 2017-08-17 DIAGNOSIS — R739 Hyperglycemia, unspecified: Secondary | ICD-10-CM

## 2017-08-17 DIAGNOSIS — Z0000 Encounter for general adult medical examination without abnormal findings: Secondary | ICD-10-CM

## 2017-08-17 NOTE — Telephone Encounter (Signed)
-----   Message from Ellamae Sia sent at 08/14/2017  9:33 AM EST ----- Regarding: Lab orders for Friday 12.14.18 Patient is scheduled for CPX labs, please order future labs, Thanks , Karna Christmas

## 2017-08-18 ENCOUNTER — Encounter: Payer: Self-pay | Admitting: Family Medicine

## 2017-08-22 ENCOUNTER — Other Ambulatory Visit (INDEPENDENT_AMBULATORY_CARE_PROVIDER_SITE_OTHER): Payer: BC Managed Care – PPO

## 2017-08-22 DIAGNOSIS — R739 Hyperglycemia, unspecified: Secondary | ICD-10-CM | POA: Diagnosis not present

## 2017-08-22 DIAGNOSIS — Z0000 Encounter for general adult medical examination without abnormal findings: Secondary | ICD-10-CM

## 2017-08-22 LAB — COMPREHENSIVE METABOLIC PANEL
ALT: 21 U/L (ref 0–35)
AST: 20 U/L (ref 0–37)
Albumin: 4.2 g/dL (ref 3.5–5.2)
Alkaline Phosphatase: 68 U/L (ref 39–117)
BUN: 13 mg/dL (ref 6–23)
CO2: 32 mEq/L (ref 19–32)
Calcium: 9.2 mg/dL (ref 8.4–10.5)
Chloride: 104 mEq/L (ref 96–112)
Creatinine, Ser: 0.7 mg/dL (ref 0.40–1.20)
GFR: 109.8 mL/min (ref >60.00)
Glucose, Bld: 91 mg/dL (ref 70–99)
Potassium: 4 mEq/L (ref 3.5–5.1)
Sodium: 142 mEq/L (ref 135–145)
Total Bilirubin: 0.4 mg/dL (ref 0.2–1.2)
Total Protein: 7.2 g/dL (ref 6.0–8.3)

## 2017-08-22 LAB — CBC WITH DIFFERENTIAL/PLATELET
Basophils Absolute: 0 10*3/uL (ref 0.0–0.1)
Basophils Relative: 0.9 % (ref 0.0–3.0)
Eosinophils Absolute: 0.1 10*3/uL (ref 0.0–0.7)
Eosinophils Relative: 2.7 % (ref 0.0–5.0)
HCT: 40.8 % (ref 36.0–46.0)
Hemoglobin: 13.2 g/dL (ref 12.0–15.0)
Lymphocytes Relative: 32.4 % (ref 12.0–46.0)
Lymphs Abs: 1.3 10*3/uL (ref 0.7–4.0)
MCHC: 32.4 g/dL (ref 30.0–36.0)
MCV: 90.7 fl (ref 78.0–100.0)
Monocytes Absolute: 0.3 10*3/uL (ref 0.1–1.0)
Monocytes Relative: 8.3 % (ref 3.0–12.0)
Neutro Abs: 2.3 10*3/uL (ref 1.4–7.7)
Neutrophils Relative %: 55.7 % (ref 43.0–77.0)
Platelets: 329 10*3/uL (ref 150.0–400.0)
RBC: 4.5 Mil/uL (ref 3.87–5.11)
RDW: 12.9 % (ref 11.5–15.5)
WBC: 4.1 10*3/uL (ref 4.0–10.5)

## 2017-08-22 LAB — LIPID PANEL
Cholesterol: 163 mg/dL (ref 0–200)
HDL: 67.8 mg/dL (ref >39.00)
LDL Cholesterol: 86 mg/dL (ref 0–99)
NonHDL: 94.8
Total CHOL/HDL Ratio: 2
Triglycerides: 45 mg/dL (ref 0.0–149.0)
VLDL: 9 mg/dL (ref 0.0–40.0)

## 2017-08-22 LAB — TSH: TSH: 2.71 u[IU]/mL (ref 0.35–4.50)

## 2017-08-22 LAB — HEMOGLOBIN A1C: Hgb A1c MFr Bld: 6.1 % (ref 4.6–6.5)

## 2017-08-26 ENCOUNTER — Ambulatory Visit (INDEPENDENT_AMBULATORY_CARE_PROVIDER_SITE_OTHER): Payer: BC Managed Care – PPO | Admitting: Family Medicine

## 2017-08-26 ENCOUNTER — Encounter: Payer: Self-pay | Admitting: Family Medicine

## 2017-08-26 ENCOUNTER — Encounter (INDEPENDENT_AMBULATORY_CARE_PROVIDER_SITE_OTHER): Payer: Self-pay

## 2017-08-26 VITALS — BP 116/72 | HR 89 | Temp 98.3°F | Ht 60.25 in | Wt 212.0 lb

## 2017-08-26 DIAGNOSIS — R739 Hyperglycemia, unspecified: Secondary | ICD-10-CM | POA: Diagnosis not present

## 2017-08-26 DIAGNOSIS — Z23 Encounter for immunization: Secondary | ICD-10-CM

## 2017-08-26 DIAGNOSIS — Z0000 Encounter for general adult medical examination without abnormal findings: Secondary | ICD-10-CM

## 2017-08-26 NOTE — Patient Instructions (Addendum)
Get back to water aerobics !   Don't forget to schedule your annual mammogram at the breast center   For your bones   Try to get 1200-1500 mg of calcium per day with at least 1000 iu of vitamin D - for bone health   Flu shot today  Tetanus shot (Tdap) today    Labs are stable For prediabetes   Try to get most of your carbohydrates from produce (with the exception of white potatoes)  Eat less bread/pasta/rice/snack foods/cereals/sweets and other items from the middle of the grocery store (processed carbs)

## 2017-08-26 NOTE — Progress Notes (Signed)
Subjective:    Patient ID: Isabella Ramirez, female    DOB: Oct 21, 1955, 61 y.o.   MRN: [id number]  HPI  Here for health maintenance exam and to review chronic medical problems    Has been good overall  Working -more this time of year  Taking care of herself  Exercise -not enough / likes water aerobics and stopped -- planning to get back to it (can do it all year)    Wt Readings from Last 3 Encounters:  08/26/17 212 lb (96.2 kg)  07/01/17 210 lb 8 oz (95.5 kg)  02/25/17 212 lb 9.6 oz (96.4 kg)  going to get back to water aerobics  Eating healthy for the most part  41.06 kg/m  Hep C screening  Declined due to low risk   Tetanus shot 9/07 Wants to get that today   Pap 1/15 nl  Then had a hysterectomy and no longer needs pap Was for fibroids  No gyn problems or issues  No new partners or need for STD screening   Mammogram 3/17 nl  Has not had one since then  She will make her own appt  Self breast exam -no breast lumps but does not check often   Flu shot -wants to get today  Colonoscopy 3/11 nl with 10 y recall   Cholesterol Lab Results  Component Value Date   CHOL 163 08/22/2017   CHOL 182 10/10/2014   CHOL 193 10/05/2013   Lab Results  Component Value Date   HDL 67.80 08/22/2017   HDL 71.20 10/10/2014   HDL 63.30 10/05/2013   Lab Results  Component Value Date   LDLCALC 86 08/22/2017   LDLCALC 97 10/10/2014   LDLCALC 116 (H) 10/05/2013   Lab Results  Component Value Date   TRIG 45.0 08/22/2017   TRIG 68.0 10/10/2014   TRIG 70.0 10/05/2013   Lab Results  Component Value Date   CHOLHDL 2 08/22/2017   CHOLHDL 3 10/10/2014   CHOLHDL 3 10/05/2013   No results found for: LDLDIRECT Improved from last time LDL  Good profile  She eats well  occ bacon and sausage  Balances out with lots of vegetables  Needs to drink more water    H/o hyperglycemia Lab Results  Component Value Date   HGBA1C 6.1 08/22/2017  stable from last check   Lab Results   Component Value Date   WBC 4.1 08/22/2017   HGB 13.2 08/22/2017   HCT 40.8 08/22/2017   MCV 90.7 08/22/2017   PLT 329.0 08/22/2017   Lab Results  Component Value Date   CREATININE 0.70 08/22/2017   BUN 13 08/22/2017   NA 142 08/22/2017   K 4.0 08/22/2017   CL 104 08/22/2017   CO2 32 08/22/2017   Lab Results  Component Value Date   ALT 21 08/22/2017   AST 20 08/22/2017   ALKPHOS 68 08/22/2017   BILITOT 0.4 08/22/2017   Lab Results  Component Value Date   TSH 2.71 08/22/2017     BP Readings from Last 3 Encounters:  08/26/17 116/72  07/01/17 124/68  02/25/17 120/79    Patient Active Problem List   Diagnosis Date Noted  . Hyperglycemia 10/10/2014  . S/P hysterectomy 06/27/2014  . Stress reaction 05/04/2014  . Routine general medical examination at a health care facility 02/15/2011  . Routine gynecological examination 02/15/2011  . Other screening mammogram 02/15/2011  . ALLERGIC RHINITIS 04/23/2007  . ASTHMA 04/23/2007   Past Medical History:  Diagnosis Date  .  Allergy    allergic rhinitis  . Asthma    as a child  . Ovarian cyst   . Uterine fibroids affecting pregnancy    Past Surgical History:  Procedure Laterality Date  . ABDOMINAL HYSTERECTOMY N/A 06/27/2014   Procedure: HYSTERECTOMY ABDOMINAL WITH BILATERAL SALPINGO OOPHORECTOMY;  Surgeon: Linda Hedges, DO;  Location: [facility];  Service: Gynecology;  Laterality: N/A;  . Ramirez  01/2005  . POPLITEAL SYNOVIAL CYST EXCISION    . TUBAL LIGATION     Social History   Tobacco Use  . Smoking status: Never Smoker  . Smokeless tobacco: Never Used  Substance Use Topics  . Alcohol use: Yes    Alcohol/week: 0.0 oz    Comment: occ wine  . Drug use: No   Family History  Problem Relation Age of Onset  . Heart disease Mother   . Hypertension Mother    Allergies  Allergen Reactions  . Shellfish Allergy Hives and Itching   Current Outpatient Medications on File Prior to Visit  Medication Sig Dispense  Refill  . albuterol (PROVENTIL HFA;VENTOLIN HFA) 108 (90 Base) MCG/ACT inhaler Inhale 2 puffs into the lungs every 4 (four) hours as needed for wheezing or shortness of breath. 1 Inhaler 0  . BIOTIN FORTE PO Take 1 tablet by mouth daily.      . calcium gluconate 500 MG tablet Take 500 mg by mouth daily.      . cetirizine (ZYRTEC) 10 MG tablet Take 10 mg by mouth daily.      . Multiple Vitamin (MULTIVITAMIN) capsule Take 1 capsule by mouth daily.      . vitamin E 400 UNIT capsule Take 400 Units by mouth daily.       No current facility-administered medications on file prior to visit.      Review of Systems  Constitutional: Negative for activity change, appetite change, fatigue, fever and unexpected weight change.  HENT: Negative for congestion, ear pain, rhinorrhea, sinus pressure and sore throat.   Eyes: Negative for pain, redness and visual disturbance.  Respiratory: Negative for cough, shortness of breath and wheezing.   Cardiovascular: Negative for chest pain and palpitations.  Gastrointestinal: Negative for abdominal pain, blood in stool, constipation and diarrhea.  Endocrine: Negative for polydipsia and polyuria.  Genitourinary: Negative for dysuria, frequency and urgency.  Musculoskeletal: Negative for arthralgias, back pain and myalgias.  Skin: Negative for pallor and rash.  Allergic/Immunologic: Negative for environmental allergies.  Neurological: Negative for dizziness, syncope and headaches.  Hematological: Negative for adenopathy. Does not bruise/bleed easily.  Psychiatric/Behavioral: Negative for decreased concentration and dysphoric mood. The patient is not nervous/anxious.        Objective:   Physical Exam  Constitutional: She appears well-developed and well-nourished. No distress.  obese and well appearing   HENT:  Head: Normocephalic and atraumatic.  Right Ear: External ear normal.  Left Ear: External ear normal.  Mouth/Throat: Oropharynx is clear and moist.    Eyes: Conjunctivae and EOM are normal. Pupils are equal, round, and reactive to light. No scleral icterus.  Neck: Normal range of motion. Neck supple. No JVD present. Carotid bruit is not present. No thyromegaly present.  Cardiovascular: Normal rate, regular rhythm, normal heart sounds and intact distal pulses. Exam reveals no gallop.  Pulmonary/Chest: Effort normal and breath sounds normal. No respiratory distress. She has no wheezes. She exhibits no tenderness.  Abdominal: Soft. Bowel sounds are normal. She exhibits no distension, no abdominal bruit and no mass. There is no tenderness.  Genitourinary: No breast swelling, tenderness, discharge or bleeding.  Genitourinary Comments: Breast exam: No mass, nodules, thickening, tenderness, bulging, retraction, inflamation, nipple discharge or skin changes noted.  No axillary or clavicular LA.      Musculoskeletal: Normal range of motion. She exhibits no edema or tenderness.  Lymphadenopathy:    She has no cervical adenopathy.  Neurological: She is alert. She has normal reflexes. No cranial nerve deficit. She exhibits normal muscle tone. Coordination normal.  Skin: Skin is warm and dry. No rash noted. No erythema. No pallor.  Solar lentigines diffusely Few sk  Psychiatric: She has a normal mood and affect.          Assessment & Plan:   Problem List Items Addressed This Visit      Other   Hyperglycemia    Lab Results  Component Value Date   HGBA1C 6.1 08/22/2017   disc imp of low glycemic diet and wt loss to prevent DM2  Literature given on prediabetes       Routine general medical examination at a health care facility - Primary    Reviewed health habits including diet and exercise and skin cancer prevention Reviewed appropriate screening tests for age  Also reviewed health mt list, fam hx and immunization status , as well as social and family history   See HPI Labs reviewed  Pt will make own mammogram appt  Flu shot today   Tdap today Avs: Get back to water aerobics !   Don't forget to schedule your annual mammogram at the breast center   For your bones   Try to get 1200-1500 mg of calcium per day with at least 1000 iu of vitamin D - for bone health   Flu shot today  Tetanus shot (Tdap) today        Other Visit Diagnoses    Need for Tdap vaccination       Relevant Orders   Tdap vaccine greater than or equal to 7yo IM (Completed)   Need for influenza vaccination       Relevant Orders   Flu Vaccine QUAD 6+ mos PF IM (Fluarix Quad PF) (Completed)

## 2017-08-27 NOTE — Assessment & Plan Note (Signed)
Reviewed health habits including diet and exercise and skin cancer prevention Reviewed appropriate screening tests for age  Also reviewed health mt list, fam hx and immunization status , as well as social and family history   See HPI Labs reviewed  Pt will make own mammogram appt  Flu shot today  Tdap today Avs: Get back to water aerobics !   Don't forget to schedule your annual mammogram at the breast center   For your bones   Try to get 1200-1500 mg of calcium per day with at least 1000 iu of vitamin D - for bone health   Flu shot today  Tetanus shot (Tdap) today

## 2017-08-27 NOTE — Assessment & Plan Note (Signed)
Lab Results  Component Value Date   HGBA1C 6.1 08/22/2017   disc imp of low glycemic diet and wt loss to prevent DM2  Literature given on prediabetes

## 2018-01-08 ENCOUNTER — Encounter: Payer: Self-pay | Admitting: Family Medicine

## 2018-01-08 ENCOUNTER — Ambulatory Visit: Payer: BC Managed Care – PPO | Admitting: Family Medicine

## 2018-01-08 VITALS — BP 122/62 | HR 91 | Temp 99.3°F | Ht 60.25 in | Wt 209.2 lb

## 2018-01-08 DIAGNOSIS — A084 Viral intestinal infection, unspecified: Secondary | ICD-10-CM | POA: Diagnosis not present

## 2018-01-08 MED ORDER — PROMETHAZINE HCL 25 MG PO TABS: 25.0000 mg | ORAL_TABLET | Freq: Three times a day (TID) | ORAL | 0 refills | Status: DC | PRN

## 2018-01-08 NOTE — Assessment & Plan Note (Signed)
Now improved  Some symptoms of dehydration but reassuring exam  Plan made for oral rehydration and handouts given Phenergan px for nausea as needed Rest/fluids Gradually adv as tol to BRAT and then regular diet Adv to clean surfaces with bleach cleanser  Update if not starting to improve in a week or if worsening

## 2018-01-08 NOTE — Progress Notes (Signed)
Subjective:    Patient ID: Isabella Ramirez, female    DOB: 1956-04-06, 62 y.o.   MRN: [id number]  HPI Here for GI virus -- diarrhea   Started Monday with chills/aches/? Fever N/v -until Wednesday  Diarrhea- that recently stopped late last night (now soft stools) A little nausea   Did have cramping -that is slowing down   Got this from her grandson (had to take him to ED on Sunday)  Now his mother has it    Feels light headed   Wt Readings from Last 3 Encounters:  01/08/18 209 lb 4 oz (94.9 kg)  08/26/17 212 lb (96.2 kg)  07/01/17 210 lb 8 oz (95.5 kg)   40.53 kg/m   BP Readings from Last 3 Encounters:  01/08/18 122/62  08/26/17 116/72  07/01/17 124/68   Pulse Readings from Last 3 Encounters:  01/08/18 91  08/26/17 89  07/01/17 87   Fluid intake-not a lot  Sips of gatorade and ginger ale (2 glasses per day)   Patient Active Problem List   Diagnosis Date Noted  . Viral gastroenteritis 01/08/2018  . Hyperglycemia 10/10/2014  . S/P hysterectomy 06/27/2014  . Stress reaction 05/04/2014  . Routine general medical examination at a health care facility 02/15/2011  . Routine gynecological examination 02/15/2011  . Other screening mammogram 02/15/2011  . ALLERGIC RHINITIS 04/23/2007  . ASTHMA 04/23/2007   Past Medical History:  Diagnosis Date  . Allergy    allergic rhinitis  . Asthma    as a child  . Ovarian cyst   . Uterine fibroids affecting pregnancy    Past Surgical History:  Procedure Laterality Date  . ABDOMINAL HYSTERECTOMY N/A 06/27/2014   Procedure: HYSTERECTOMY ABDOMINAL WITH BILATERAL SALPINGO OOPHORECTOMY;  Surgeon: Linda Hedges, DO;  Location: [facility];  Service: Gynecology;  Laterality: N/A;  . Ramirez  01/2005  . POPLITEAL SYNOVIAL CYST EXCISION    . TUBAL LIGATION     Social History   Tobacco Use  . Smoking status: Never Smoker  . Smokeless tobacco: Never Used  Substance Use Topics  . Alcohol use: Yes    Alcohol/week: 0.0 oz    Comment:  occ wine  . Drug use: No   Family History  Problem Relation Age of Onset  . Heart disease Mother   . Hypertension Mother    Allergies  Allergen Reactions  . Shellfish Allergy Hives and Itching   Current Outpatient Medications on File Prior to Visit  Medication Sig Dispense Refill  . albuterol (PROVENTIL HFA;VENTOLIN HFA) 108 (90 Base) MCG/ACT inhaler Inhale 2 puffs into the lungs every 4 (four) hours as needed for wheezing or shortness of breath. 1 Inhaler 0  . BIOTIN FORTE PO Take 1 tablet by mouth daily.      . calcium gluconate 500 MG tablet Take 500 mg by mouth daily.      . cetirizine (ZYRTEC) 10 MG tablet Take 10 mg by mouth daily.      . Multiple Vitamin (MULTIVITAMIN) capsule Take 1 capsule by mouth daily.      . vitamin E 400 UNIT capsule Take 400 Units by mouth daily.       No current facility-administered medications on file prior to visit.     Review of Systems  Constitutional: Negative for activity change, appetite change, fatigue, fever and unexpected weight change.  HENT: Negative for congestion, ear pain, rhinorrhea, sinus pressure and sore throat.   Eyes: Negative for pain, redness and visual disturbance.  Respiratory:  Negative for cough, shortness of breath and wheezing.   Cardiovascular: Negative for chest pain and palpitations.  Gastrointestinal: Positive for diarrhea and nausea. Negative for abdominal distention, abdominal pain, blood in stool, constipation, rectal pain and vomiting.  Endocrine: Negative for polydipsia and polyuria.  Genitourinary: Negative for dysuria, frequency and urgency.  Musculoskeletal: Negative for arthralgias, back pain and myalgias.  Skin: Negative for pallor and rash.  Allergic/Immunologic: Negative for environmental allergies.  Neurological: Positive for light-headedness. Negative for dizziness, syncope and headaches.  Hematological: Negative for adenopathy. Does not bruise/bleed easily.  Psychiatric/Behavioral: Negative for  decreased concentration and dysphoric mood. The patient is not nervous/anxious.        Objective:   Physical Exam  Constitutional: She appears well-developed and well-nourished. No distress.  Well appearing (states she is feeling better)   HENT:  Head: Normocephalic and atraumatic.  Mouth/Throat: Oropharynx is clear and moist.  MMM  Eyes: Pupils are equal, round, and reactive to light. Conjunctivae and EOM are normal. No scleral icterus.  Neck: Normal range of motion. Neck supple.  Cardiovascular: Normal rate, regular rhythm and normal heart sounds.  Pulmonary/Chest: Effort normal and breath sounds normal. No respiratory distress. She has no wheezes. She has no rales.  Abdominal: Soft. Bowel sounds are normal. She exhibits no distension and no mass. There is no tenderness. There is no rebound and no guarding. No hernia.  No abd tenderness bs are normal   Lymphadenopathy:    She has no cervical adenopathy.  Neurological: She is alert.  Skin: Skin is warm and dry. Capillary refill takes less than 2 seconds. No erythema. No pallor.  No jaundice   Psychiatric: She has a normal mood and affect.          Assessment & Plan:   Problem List Items Addressed This Visit      Digestive   Viral gastroenteritis - Primary    Now improved  Some symptoms of dehydration but reassuring exam  Plan made for oral rehydration and handouts given Phenergan px for nausea as needed Rest/fluids Gradually adv as tol to BRAT and then regular diet Adv to clean surfaces with bleach cleanser  Update if not starting to improve in a week or if worsening

## 2018-01-08 NOTE — Patient Instructions (Signed)
Start sipping fluids (clear)  Stir bubbles out of carbonation   When you start eating - BRAT (banana,rice,applesauce and toast)  Small amounts and advance slowly as tolerated   Phenergan - for nausea as needed (it will sedate) Rest

## 2018-02-19 ENCOUNTER — Ambulatory Visit: Payer: BC Managed Care – PPO | Admitting: Family Medicine

## 2018-02-19 ENCOUNTER — Encounter: Payer: Self-pay | Admitting: Family Medicine

## 2018-02-19 VITALS — BP 122/68 | HR 87 | Temp 98.4°F | Ht 60.25 in | Wt 214.0 lb

## 2018-02-19 DIAGNOSIS — J019 Acute sinusitis, unspecified: Secondary | ICD-10-CM | POA: Insufficient documentation

## 2018-02-19 DIAGNOSIS — J0100 Acute maxillary sinusitis, unspecified: Secondary | ICD-10-CM | POA: Diagnosis not present

## 2018-02-19 MED ORDER — AMOXICILLIN-POT CLAVULANATE 875-125 MG PO TABS: 1.0000 | ORAL_TABLET | Freq: Two times a day (BID) | ORAL | 0 refills | Status: DC

## 2018-02-19 MED ORDER — BENZONATATE 200 MG PO CAPS: 200.0000 mg | ORAL_CAPSULE | Freq: Three times a day (TID) | ORAL | 1 refills | Status: DC | PRN

## 2018-02-19 NOTE — Assessment & Plan Note (Signed)
Cover with augmentin  Disc symptomatic care - see instructions on AVS  Update if not starting to improve in a week or if worsening    Tessalon  mucinex DM

## 2018-02-19 NOTE — Progress Notes (Signed)
Subjective:    Patient ID: Isabella Ramirez, female    DOB: 1956/04/01, 62 y.o.   MRN: [id number]  HPI Here for uri symptoms  Started last Thursday   ? Sinus infection    Cough- productive / yellow mucous  A little wheezing- used her inhaler   Congestion -nasal/colored  Facial pain -under eyes / some all over headache   Hoarse voice- thick feeling throat   Has to sleep sitting up   Temp: 98.4 F (36.9 C)  Felt cold yesterday   Otc: - took mucinex    Patient Active Problem List   Diagnosis Date Noted  . Acute sinusitis 02/19/2018  . Viral gastroenteritis 01/08/2018  . Hyperglycemia 10/10/2014  . S/P hysterectomy 06/27/2014  . Stress reaction 05/04/2014  . Routine general medical examination at a health care facility 02/15/2011  . Routine gynecological examination 02/15/2011  . Other screening mammogram 02/15/2011  . ALLERGIC RHINITIS 04/23/2007  . ASTHMA 04/23/2007   Past Medical History:  Diagnosis Date  . Allergy    allergic rhinitis  . Asthma    as a child  . Ovarian cyst   . Uterine fibroids affecting pregnancy    Past Surgical History:  Procedure Laterality Date  . ABDOMINAL HYSTERECTOMY N/A 06/27/2014   Procedure: HYSTERECTOMY ABDOMINAL WITH BILATERAL SALPINGO OOPHORECTOMY;  Surgeon: Linda Hedges, DO;  Location: [facility];  Service: Gynecology;  Laterality: N/A;  . Ramirez  01/2005  . POPLITEAL SYNOVIAL CYST EXCISION    . TUBAL LIGATION     Social History   Tobacco Use  . Smoking status: Never Smoker  . Smokeless tobacco: Never Used  Substance Use Topics  . Alcohol use: Yes    Alcohol/week: 0.0 oz    Comment: occ wine  . Drug use: No   Family History  Problem Relation Age of Onset  . Heart disease Mother   . Hypertension Mother    Allergies  Allergen Reactions  . Shellfish Allergy Hives and Itching   Current Outpatient Medications on File Prior to Visit  Medication Sig Dispense Refill  . albuterol (PROVENTIL HFA;VENTOLIN HFA) 108 (90  Base) MCG/ACT inhaler Inhale 2 puffs into the lungs every 4 (four) hours as needed for wheezing or shortness of breath. 1 Inhaler 0  . BIOTIN FORTE PO Take 1 tablet by mouth daily.      . calcium gluconate 500 MG tablet Take 500 mg by mouth daily.      . cetirizine (ZYRTEC) 10 MG tablet Take 10 mg by mouth daily.      . Multiple Vitamin (MULTIVITAMIN) capsule Take 1 capsule by mouth daily.      . promethazine (PHENERGAN) 25 MG tablet Take 1 tablet (25 mg total) by mouth every 8 (eight) hours as needed for nausea or vomiting. Caution of sedation 20 tablet 0  . vitamin E 400 UNIT capsule Take 400 Units by mouth daily.       No current facility-administered medications on file prior to visit.       Review of Systems  Constitutional: Positive for appetite change. Negative for fatigue and fever.  HENT: Positive for congestion, ear pain, postnasal drip, rhinorrhea, sinus pressure and sore throat. Negative for nosebleeds.   Eyes: Negative for pain, redness and itching.  Respiratory: Positive for cough. Negative for shortness of breath and wheezing.   Cardiovascular: Negative for chest pain.  Gastrointestinal: Negative for abdominal pain, diarrhea, nausea and vomiting.  Endocrine: Negative for polyuria.  Genitourinary: Negative for dysuria, frequency  and urgency.  Musculoskeletal: Negative for arthralgias and myalgias.  Allergic/Immunologic: Negative for immunocompromised state.  Neurological: Positive for headaches. Negative for dizziness, tremors, syncope, weakness and numbness.  Hematological: Negative for adenopathy. Does not bruise/bleed easily.  Psychiatric/Behavioral: Negative for dysphoric mood. The patient is not nervous/anxious.        Objective:   Physical Exam  Constitutional: She appears well-developed and well-nourished. No distress.  HENT:  Head: Normocephalic and atraumatic.  Right Ear: External ear normal.  Left Ear: External ear normal.  Mouth/Throat: Oropharynx is clear  and moist. No oropharyngeal exudate.  Nares are injected and congested  Bilateral maxillary sinus tenderness  Post nasal drip   Eyes: Pupils are equal, round, and reactive to light. Conjunctivae and EOM are normal. Right eye exhibits no discharge. Left eye exhibits no discharge.  Neck: Normal range of motion. Neck supple.  Cardiovascular: Normal rate and regular rhythm.  Pulmonary/Chest: Effort normal and breath sounds normal. No respiratory distress. She has no wheezes. She has no rales.  Lymphadenopathy:    She has no cervical adenopathy.  Neurological: She is alert. No cranial nerve deficit.  Skin: Skin is warm and dry. No rash noted.  Psychiatric: She has a normal mood and affect.          Assessment & Plan:   Problem List Items Addressed This Visit      Respiratory   Acute sinusitis    Cover with augmentin  Disc symptomatic care - see instructions on AVS  Update if not starting to improve in a week or if worsening    Tessalon  mucinex DM       Relevant Medications   amoxicillin-clavulanate (AUGMENTIN) 875-125 MG tablet   benzonatate (TESSALON) 200 MG capsule

## 2018-02-19 NOTE — Patient Instructions (Signed)
Take augmentin for sinus infection  Rest/ fluids Tessalon for cough  mucinex DM for cough   If chills/fever - tylenol or ibuprofen   Breathe steam  Warm compress on face also   Update if not starting to improve in a week or if worsening

## 2018-07-14 ENCOUNTER — Ambulatory Visit: Payer: BC Managed Care – PPO | Admitting: Family Medicine

## 2018-07-14 DIAGNOSIS — Z0289 Encounter for other administrative examinations: Secondary | ICD-10-CM

## 2019-07-29 ENCOUNTER — Telehealth: Payer: Self-pay

## 2019-07-29 NOTE — Telephone Encounter (Signed)
I spoke with pt; pt went to [facility] on 07/28/19; did flu test that was neg.Did a covid test and awaiting results. Told likely had virus; given 2 meds; cough med, Benzonatate and ondansatron for nausea. Pt is getting filled today. Ibuprofen is helping the fever. Pt will cb if does not improve. FYI to Dr Glori Bickers.

## 2019-07-29 NOTE — Telephone Encounter (Signed)
Aware, thanks!

## 2019-07-29 NOTE — Telephone Encounter (Signed)
[facility] Night - Client TELEPHONE ADVICE RECORD [facility] Patient Name: Isabella Ramirez Gender: Female DOB: 12-Jun-1956 Age: 63 Y 10 M 13 D Return Phone Number: [phone] (Primary) Address: City/State/Zip: [name] [address] Client [facility] Night - Client Client Site [name], Roque Lias - MD Contact Type Call Who Is Calling Patient / Member / Family / Caregiver Call Type Triage / Clinical Relationship To Patient Self Return Phone Number [phone] (Primary) Chief Complaint Fever (non urgent symptom) (> THREE MONTHS) Reason for Call Symptomatic / Request for [place] states she has a fever of 103, chills, sweats, and a cough. Wants to know what they should be doing. Translation No Nurse Assessment Nurse: Artemio Aly, RN, Gerald Stabs Date/Time (Eastern Time): 07/28/2019 5:20:31 PM Confirm and document reason for call. If symptomatic, describe symptoms. ---Caller states she has a fever of 103 orally, chills, sweats, headache, and a cough. Feeling bad since Sunday. No other symptoms. Has the patient had close contact with a person known or suspected to have the novel coronavirus illness OR traveled / lives in area with major community spread (including international travel) in the last 14 days from the onset of symptoms? * If Asymptomatic, screen for exposure and travel within the last 14 days. ---No Does the patient have any new or worsening symptoms? ---Yes Will a triage be completed? ---Yes Related visit to physician within the last 2 weeks? ---No Does the PT have any chronic conditions? (i.e. diabetes, asthma, this includes High risk factors for pregnancy, etc.) ---No Is this a behavioral health or substance abuse call? ---No Guidelines Guideline Title Affirmed Question Affirmed Notes Nurse Date/Time (Eastern Time) Coronavirus (COVID-19) -  Diagnosed or Suspected [1] Fever > 101 F (38.3 C) AND [2] age > 100 Artemio Aly, NewhallGerald Stabs 07/28/2019 5:22:04 PM Disp. Time Eilene Ghazi Time) Disposition Final User 07/28/2019 5:25:41 PM See HCP within 4 Hours (or PCP triage) Yes Artemio Aly, RN, Gerald Stabs PLEASE NOTE: All timestamps contained within this report are represented as [place] Standard Time. CONFIDENTIALTY NOTICE: This fax transmission is intended only for the addressee. It contains information that is legally privileged, confidential or otherwise protected from use or disclosure. If you are not the intended recipient, you are strictly prohibited from reviewing, disclosing, copying using or disseminating any of this information or taking any action in reliance on or regarding this information. If you have received this fax in error, please notify us immediately by telephone so that we can arrange for its return to [place]. Phone: [phone], Toll-Free: [phone], Fax: [phone] Page: 2 of 2 Call Id: [id number] [place] Disagree/Comply Comply Caller Understands Yes PreDisposition Call Doctor Care Advice Given Per Guideline SEE HCP WITHIN 4 HOURS (OR PCP TRIAGE): * IF OFFICE WILL BE CLOSED AND NO PCP (PRIMARY CARE PROVIDER) SECOND-LEVEL TRIAGE: You need to be seen within the next 3 or 4 hours. A nearby Urgent Care Center [facility]) is often a good source of care. Another choice is to go to the ED. Go sooner if you become worse. CALL BACK IF: * You become worse. CARE ADVICE given per CORONAVIRUS (COVID-19) - DIAGNOSED OR SUSPECTED (Adult) guideline. Referrals GO TO FACILITY UNDECID

## 2019-08-02 NOTE — Telephone Encounter (Signed)
Sounds like they could not get in touch with her

## 2019-08-02 NOTE — Telephone Encounter (Signed)
[facility] Night - Client TELEPHONE ADVICE RECORD [facility] Patient Name: Isabella Ramirez Gender: Female DOB: 09-18-1955 Age: 63 Y 10 M 15 D Return Phone Number: [phone] (Primary) Address: City/State/Zip: [name] [address] Client [facility] Night - Client Client Site [facility], Roque Lias - MD Contact Type Call Who Is Calling Patient / Member / Family / Caregiver Call Type Triage / Clinical Relationship To Patient Self Return Phone Number [phone] (Primary) Chief Complaint Cough Reason for Call Symptomatic / Request for [place] states she is positive for covid-19. Patient reports nausea, cough, and body aches Translation No Nurse Assessment Guidelines Guideline Title Affirmed Question Affirmed Notes Nurse Date/Time (Eastern Time) Disp. Time Eilene Ghazi Time) Disposition Final User 07/30/2019 6:45:10 PM Attempt made - message left [place]as[place], RN, Lattie Haw 07/30/2019 6:57:07 PM FINAL ATTEMPT MADE - message left Yes McCasland, RN, Lattie Haw

## 2019-11-09 ENCOUNTER — Other Ambulatory Visit: Payer: Self-pay | Admitting: Family Medicine

## 2019-11-09 ENCOUNTER — Telehealth: Payer: Self-pay | Admitting: Family Medicine

## 2019-11-09 DIAGNOSIS — R7303 Prediabetes: Secondary | ICD-10-CM

## 2019-11-09 DIAGNOSIS — Z0000 Encounter for general adult medical examination without abnormal findings: Secondary | ICD-10-CM

## 2019-11-09 DIAGNOSIS — Z1231 Encounter for screening mammogram for malignant neoplasm of breast: Secondary | ICD-10-CM

## 2019-11-09 NOTE — Telephone Encounter (Signed)
-----   Message from Cloyd Stagers, RT sent at 11/09/2019  2:44 PM EST ----- Regarding: Lab Orders for Thursday 3.4.2021 Please place lab orders for Thursday 3.4.2021, office visit for physical on Friday 3.5.2021 Thank you, Dyke Maes RT(R)

## 2019-11-10 ENCOUNTER — Ambulatory Visit
Admission: RE | Admit: 2019-11-10 | Discharge: 2019-11-10 | Disposition: A | Discharge status: Home / Self Care | Payer: BC Managed Care – PPO | Source: Ambulatory Visit | Attending: Family Medicine | Admitting: Family Medicine

## 2019-11-10 ENCOUNTER — Other Ambulatory Visit: Payer: Self-pay

## 2019-11-10 DIAGNOSIS — Z1231 Encounter for screening mammogram for malignant neoplasm of breast: Secondary | ICD-10-CM

## 2019-11-11 ENCOUNTER — Other Ambulatory Visit (INDEPENDENT_AMBULATORY_CARE_PROVIDER_SITE_OTHER): Payer: BC Managed Care – PPO

## 2019-11-11 DIAGNOSIS — R7303 Prediabetes: Secondary | ICD-10-CM

## 2019-11-11 DIAGNOSIS — Z0000 Encounter for general adult medical examination without abnormal findings: Secondary | ICD-10-CM

## 2019-11-11 LAB — LIPID PANEL
Cholesterol: 180 mg/dL (ref 0–200)
HDL: 61.5 mg/dL (ref >39.00)
LDL Cholesterol: 105 mg/dL — ABNORMAL HIGH (ref 0–99)
NonHDL: 118.95
Total CHOL/HDL Ratio: 3
Triglycerides: 69 mg/dL (ref 0.0–149.0)
VLDL: 13.8 mg/dL (ref 0.0–40.0)

## 2019-11-11 LAB — CBC WITH DIFFERENTIAL/PLATELET
Basophils Absolute: 0 10*3/uL (ref 0.0–0.1)
Basophils Relative: 0.8 % (ref 0.0–3.0)
Eosinophils Absolute: 0.1 10*3/uL (ref 0.0–0.7)
Eosinophils Relative: 3.7 % (ref 0.0–5.0)
HCT: 41 % (ref 36.0–46.0)
Hemoglobin: 13.7 g/dL (ref 12.0–15.0)
Lymphocytes Relative: 30.6 % (ref 12.0–46.0)
Lymphs Abs: 1.2 10*3/uL (ref 0.7–4.0)
MCHC: 33.3 g/dL (ref 30.0–36.0)
MCV: 89.6 fl (ref 78.0–100.0)
Monocytes Absolute: 0.3 10*3/uL (ref 0.1–1.0)
Monocytes Relative: 7.1 % (ref 3.0–12.0)
Neutro Abs: 2.2 10*3/uL (ref 1.4–7.7)
Neutrophils Relative %: 57.8 % (ref 43.0–77.0)
Platelets: 345 10*3/uL (ref 150.0–400.0)
RBC: 4.58 Mil/uL (ref 3.87–5.11)
RDW: 13.6 % (ref 11.5–15.5)
WBC: 3.8 10*3/uL — ABNORMAL LOW (ref 4.0–10.5)

## 2019-11-11 LAB — COMPREHENSIVE METABOLIC PANEL
ALT: 17 U/L (ref 0–35)
AST: 18 U/L (ref 0–37)
Albumin: 3.9 g/dL (ref 3.5–5.2)
Alkaline Phosphatase: 68 U/L (ref 39–117)
BUN: 11 mg/dL (ref 6–23)
CO2: 32 mEq/L (ref 19–32)
Calcium: 9.5 mg/dL (ref 8.4–10.5)
Chloride: 102 mEq/L (ref 96–112)
Creatinine, Ser: 0.79 mg/dL (ref 0.40–1.20)
GFR: 89.18 mL/min (ref >60.00)
Glucose, Bld: 91 mg/dL (ref 70–99)
Potassium: 3.9 mEq/L (ref 3.5–5.1)
Sodium: 139 mEq/L (ref 135–145)
Total Bilirubin: 0.5 mg/dL (ref 0.2–1.2)
Total Protein: 7.2 g/dL (ref 6.0–8.3)

## 2019-11-11 LAB — TSH: TSH: 2.35 u[IU]/mL (ref 0.35–4.50)

## 2019-11-11 LAB — HEMOGLOBIN A1C: Hgb A1c MFr Bld: 5.8 % (ref 4.6–6.5)

## 2019-11-12 ENCOUNTER — Encounter: Payer: BC Managed Care – PPO | Admitting: Family Medicine

## 2019-11-12 ENCOUNTER — Other Ambulatory Visit: Payer: Self-pay | Admitting: Family Medicine

## 2019-11-12 DIAGNOSIS — R928 Other abnormal and inconclusive findings on diagnostic imaging of breast: Secondary | ICD-10-CM

## 2019-11-16 ENCOUNTER — Ambulatory Visit
Admission: RE | Admit: 2019-11-16 | Discharge: 2019-11-16 | Disposition: A | Discharge status: Home / Self Care | Payer: BC Managed Care – PPO | Source: Ambulatory Visit | Attending: Family Medicine | Admitting: Family Medicine

## 2019-11-16 ENCOUNTER — Other Ambulatory Visit: Payer: Self-pay

## 2019-11-16 DIAGNOSIS — R928 Other abnormal and inconclusive findings on diagnostic imaging of breast: Secondary | ICD-10-CM

## 2019-11-18 ENCOUNTER — Ambulatory Visit: Payer: BC Managed Care – PPO | Attending: Family

## 2019-11-18 DIAGNOSIS — Z23 Encounter for immunization: Secondary | ICD-10-CM

## 2019-11-18 NOTE — Progress Notes (Signed)
   Covid-19 Vaccination Clinic  Name:  Isabella Ramirez    MRN: [id number] DOB: [birth year]  11/18/2019  Isabella Ramirez was observed post Covid-19 immunization for 15 minutes without incident. She was provided with Vaccine Information Sheet and instruction to access the V-Safe system.   Isabella Ramirez was instructed to call 911 with any severe reactions post vaccine: Marland Kitchen Difficulty breathing  . Swelling of face and throat  . A fast heartbeat  . A bad rash all over body  . Dizziness and weakness   Immunizations Administered    Name Date Dose VIS Date Route   Moderna COVID-19 Vaccine 11/18/2019 11:16 AM 0.5 mL 08/10/2019 Intramuscular   Manufacturer: Moderna   Lot: [id number]   [id number]

## 2019-11-22 ENCOUNTER — Other Ambulatory Visit: Payer: Self-pay

## 2019-11-22 ENCOUNTER — Encounter: Payer: Self-pay | Admitting: Family Medicine

## 2019-11-22 ENCOUNTER — Ambulatory Visit (INDEPENDENT_AMBULATORY_CARE_PROVIDER_SITE_OTHER): Payer: BC Managed Care – PPO | Admitting: Family Medicine

## 2019-11-22 VITALS — BP 128/80 | HR 80 | Temp 96.9°F | Ht 60.0 in | Wt 197.3 lb

## 2019-11-22 DIAGNOSIS — R7303 Prediabetes: Secondary | ICD-10-CM | POA: Diagnosis not present

## 2019-11-22 DIAGNOSIS — Z0000 Encounter for general adult medical examination without abnormal findings: Secondary | ICD-10-CM | POA: Diagnosis not present

## 2019-11-22 DIAGNOSIS — F4321 Adjustment disorder with depressed mood: Secondary | ICD-10-CM | POA: Insufficient documentation

## 2019-11-22 DIAGNOSIS — F4320 Adjustment disorder, unspecified: Secondary | ICD-10-CM

## 2019-11-22 NOTE — Patient Instructions (Addendum)
If you are interested in the shingles vaccine series (Shingrix), call your insurance or pharmacy to check on coverage and location it must be given.  If affordable - you can schedule it here or at your pharmacy depending on coverage   Keep taking good care of yourself   If you become interested in grief counseling let me know   Labs are stable  Get outdoors and keep moving

## 2019-11-22 NOTE — Progress Notes (Signed)
Subjective:    Patient ID: Isabella Ramirez, female    DOB: December 21, 1955, 64 y.o.   MRN: [id number]  HPI Here for health maintenance exam and to review chronic medical problems    Wt Readings from Last 3 Encounters:  11/22/19 197 lb 5 oz (89.5 kg)  02/19/18 214 lb (97.1 kg)  01/08/18 209 lb 4 oz (94.9 kg)  lost weight - with grief  Is eating a lot more healthy now  Plans to start back walking regularly   (goes with her grandson)  38.53 kg/m   Had covid in nov  Husband died unexpectedly in 09-03-23 (she found him)   No grief counseling  Has a very supportive family and boss Good church family and pastor  Very challenging  Is taking care of herself  Sleep is sporatic   Anxiety comes and goes -improved   Some days she is not very hungry    Pt had first covid vaccine on 3/11 (moderna)  Zoster status -interested in shingrix  Tdap 12/18  Flu shot -did not get   Colonoscopy 3/11 with 10 y recall    Mammogram 3/21 -had to re check with [place] for b9 breast cysts Self breast exam -no lumps   Has had a hysterectomy in the past   BP Readings from Last 3 Encounters:  11/22/19 128/80  02/19/18 122/68  01/08/18 122/62   Pulse Readings from Last 3 Encounters:  11/22/19 80  02/19/18 87  01/08/18 91    Prediabetes Lab Results  Component Value Date   HGBA1C 5.8 11/11/2019   Cholesterol Lab Results  Component Value Date   CHOL 180 11/11/2019   CHOL 163 08/22/2017   CHOL 182 10/10/2014   Lab Results  Component Value Date   HDL 61.50 11/11/2019   HDL 67.80 08/22/2017   HDL 71.20 10/10/2014   Lab Results  Component Value Date   LDLCALC 105 (H) 11/11/2019   [place] 86 08/22/2017   LDLCALC 97 10/10/2014   Lab Results  Component Value Date   TRIG 69.0 11/11/2019   TRIG 45.0 08/22/2017   TRIG 68.0 10/10/2014   Lab Results  Component Value Date   CHOLHDL 3 11/11/2019   CHOLHDL 2 08/22/2017   CHOLHDL 3 10/10/2014   No results found for: LDLDIRECT  Patient  Active Problem List   Diagnosis Date Noted  . Grief reaction 11/22/2019  . Prediabetes 10/10/2014  . S/P hysterectomy 06/27/2014  . Stress reaction 05/04/2014  . Routine general medical examination at a health care facility 02/15/2011  . Routine gynecological examination 02/15/2011  . Other screening mammogram 02/15/2011  . ALLERGIC RHINITIS 04/23/2007  . ASTHMA 04/23/2007   Past Medical History:  Diagnosis Date  . Allergy    allergic rhinitis  . Asthma    as a child  . Ovarian cyst   . Uterine fibroids affecting pregnancy    Past Surgical History:  Procedure Laterality Date  . ABDOMINAL HYSTERECTOMY N/A 06/27/2014   Procedure: HYSTERECTOMY ABDOMINAL WITH BILATERAL SALPINGO OOPHORECTOMY;  Surgeon: Linda Hedges, DO;  Location: [facility];  Service: Gynecology;  Laterality: N/A;  . Ramirez  01/2005  . POPLITEAL SYNOVIAL CYST EXCISION    . TUBAL LIGATION     Social History   Tobacco Use  . Smoking status: Never Smoker  . Smokeless tobacco: Never Used  Substance Use Topics  . Alcohol use: Yes    Alcohol/week: 0.0 standard drinks    Comment: occ wine  . Drug use: No  Family History  Problem Relation Age of Onset  . Heart disease Mother   . Hypertension Mother    Allergies  Allergen Reactions  . Shellfish Allergy Hives and Itching   Current Outpatient Medications on File Prior to Visit  Medication Sig Dispense Refill  . Ascorbic Acid (VITAMIN C PO) Take 1 tablet by mouth daily.    . cetirizine (ZYRTEC) 10 MG tablet Take 10 mg by mouth daily as needed.     . Multiple Vitamin (MULTIVITAMIN) capsule Take 1 capsule by mouth daily.      Marland Kitchen VITAMIN D PO Take 1 capsule by mouth daily.    . vitamin E 400 UNIT capsule Take 400 Units by mouth daily.      Marland Kitchen BIOTIN FORTE PO Take 1 tablet by mouth daily.       No current facility-administered medications on file prior to visit.     Review of Systems  Constitutional: Positive for fatigue. Negative for activity change, appetite  change, fever and unexpected weight change.  HENT: Negative for congestion, ear pain, rhinorrhea, sinus pressure and sore throat.   Eyes: Negative for pain, redness and visual disturbance.  Respiratory: Negative for cough, shortness of breath and wheezing.   Cardiovascular: Negative for chest pain and palpitations.  Gastrointestinal: Negative for abdominal pain, blood in stool, constipation and diarrhea.  Endocrine: Negative for polydipsia and polyuria.  Genitourinary: Negative for dysuria, frequency and urgency.  Musculoskeletal: Negative for arthralgias, back pain and myalgias.  Skin: Negative for pallor and rash.  Allergic/Immunologic: Negative for environmental allergies.  Neurological: Negative for dizziness, syncope and headaches.  Hematological: Negative for adenopathy. Does not bruise/bleed easily.  Psychiatric/Behavioral: Positive for dysphoric mood and sleep disturbance. Negative for decreased concentration. The patient is nervous/anxious.        Grief reaction        Objective:   Physical Exam Constitutional:      General: She is not in acute distress.    Appearance: Normal appearance. She is well-developed. She is obese. She is not ill-appearing or diaphoretic.  HENT:     Head: Normocephalic and atraumatic.     Right Ear: Tympanic membrane, ear canal and external ear normal.     Left Ear: Tympanic membrane, ear canal and external ear normal.     Nose: Nose normal. No congestion.     Mouth/Throat:     Mouth: Mucous membranes are moist.     Pharynx: Oropharynx is clear. No posterior oropharyngeal erythema.  Eyes:     General: No scleral icterus.    Extraocular Movements: Extraocular movements intact.     Conjunctiva/sclera: Conjunctivae normal.     Pupils: Pupils are equal, round, and reactive to light.  Neck:     Thyroid: No thyromegaly.     Vascular: No carotid bruit or JVD.  Cardiovascular:     Rate and Rhythm: Normal rate and regular rhythm.     Pulses: Normal  pulses.     Heart sounds: Normal heart sounds. No gallop.   Pulmonary:     Effort: Pulmonary effort is normal. No respiratory distress.     Breath sounds: Normal breath sounds. No wheezing.     Comments: Good air exch Chest:     Chest wall: No tenderness.  Abdominal:     General: Bowel sounds are normal. There is no distension or abdominal bruit.     Palpations: Abdomen is soft. There is no mass.     Tenderness: There is no abdominal tenderness.  Hernia: No hernia is present.  Genitourinary:    Comments: Breast exam: No mass, nodules, thickening, tenderness, bulging, retraction, inflamation, nipple discharge or skin changes noted.  No axillary or clavicular LA.     Musculoskeletal:        General: No tenderness. Normal range of motion.     Cervical back: Normal range of motion and neck supple. No rigidity. No muscular tenderness.     Right lower leg: No edema.     Left lower leg: No edema.     Comments: No kyphosis   Lymphadenopathy:     Cervical: No cervical adenopathy.  Skin:    General: Skin is warm and dry.     Coloration: Skin is not pale.     Findings: No erythema or rash.     Comments: Few lentigines and skin tags   Neurological:     Mental Status: She is alert. Mental status is at baseline.     Cranial Nerves: No cranial nerve deficit.     Motor: No abnormal muscle tone.     Coordination: Coordination normal.     Gait: Gait normal.     Deep Tendon Reflexes: Reflexes are normal and symmetric. Reflexes normal.  Psychiatric:        Mood and Affect: Mood normal.        Cognition and Memory: Cognition and memory normal.     Comments: Overall nl mood Becomes tearful when disc loss of husband Candidly discusses grief            Assessment & Plan:   Problem List Items Addressed This Visit      Other   Routine general medical examination at a health care facility - Primary    Reviewed health habits including diet and exercise and skin cancer  prevention Reviewed appropriate screening tests for age  Also reviewed health mt list, fam hx and immunization status , as well as social and family history   See HPI Labs reviewed Wt loss commended  Disc shingrix vaccine in the future Had first covid vaccine         Prediabetes    Lab Results  Component Value Date   HGBA1C 5.8 11/11/2019   Stable  disc imp of low glycemic diet and wt loss to prevent DM2       Grief reaction    Husband unexpectedly died (sudden cardiac) in December  Good support from family and friends and church Good outlook re: grief and future life  Anxiety is improving and sleep sporatic Offered grief counseling if desired at any time Stressed the importance of self care

## 2019-11-22 NOTE — Assessment & Plan Note (Signed)
Reviewed health habits including diet and exercise and skin cancer prevention Reviewed appropriate screening tests for age  Also reviewed health mt list, fam hx and immunization status , as well as social and family history   See HPI Labs reviewed Wt loss commended  Disc shingrix vaccine in the future Had first covid vaccine

## 2019-11-22 NOTE — Assessment & Plan Note (Signed)
Lab Results  Component Value Date   HGBA1C 5.8 11/11/2019   Stable  disc imp of low glycemic diet and wt loss to prevent DM2

## 2019-11-22 NOTE — Assessment & Plan Note (Signed)
Husband unexpectedly died (sudden cardiac) in 2023-09-14  Good support from family and friends and church Good outlook re: grief and future life  Anxiety is improving and sleep sporatic Offered grief counseling if desired at any time Stressed the importance of self care

## 2019-12-21 ENCOUNTER — Ambulatory Visit: Payer: BC Managed Care – PPO | Attending: Family

## 2019-12-21 DIAGNOSIS — Z23 Encounter for immunization: Secondary | ICD-10-CM

## 2019-12-21 NOTE — Progress Notes (Signed)
   Covid-19 Vaccination Clinic  Name:  Isabella Ramirez    MRN: [id number] DOB: 1956/03/07  12/21/2019  [name] was observed post Covid-19 immunization for 15 minutes without incident. She was provided with Vaccine Information Sheet and instruction to access the V-Safe system.   [name] was instructed to call 911 with any severe reactions post vaccine: Marland Kitchen Difficulty breathing  . Swelling of face and throat  . A fast heartbeat  . A bad rash all over body  . Dizziness and weakness   Immunizations Administered    Name Date Dose VIS Date Route   Moderna COVID-19 Vaccine 12/21/2019  2:05 PM 0.5 mL 08/10/2019 Intramuscular   Manufacturer: Moderna   Lot: [id number]   [id number]

## 2020-02-23 ENCOUNTER — Encounter: Payer: Self-pay | Admitting: Family Medicine

## 2020-02-23 ENCOUNTER — Other Ambulatory Visit: Payer: Self-pay

## 2020-02-23 ENCOUNTER — Ambulatory Visit: Payer: BC Managed Care – PPO | Admitting: Family Medicine

## 2020-02-23 DIAGNOSIS — M25512 Pain in left shoulder: Secondary | ICD-10-CM | POA: Insufficient documentation

## 2020-02-23 MED ORDER — NAPROXEN 375 MG PO TABS: 375.0000 mg | ORAL_TABLET | Freq: Two times a day (BID) | ORAL | 0 refills | Status: DC

## 2020-02-23 NOTE — Progress Notes (Signed)
This visit was conducted in person.  BP 120/76 (BP Location: Left Arm, Patient Position: Sitting, Cuff Size: Large)   Pulse 72   Temp 98.2 F (36.8 C) (Temporal)   Ht 5' (1.524 m)   Wt 196 lb (88.9 kg)   LMP 12/07/2010   SpO2 97%   BMI 38.28 kg/m    CC: L arm pain Subjective:    Patient ID: Isabella Ramirez, female    DOB: 04-Jul-1956, 64 y.o.   MRN: [id number]  HPI: [name] is a 63 y.o. female presenting on 02/23/2020 for Arm Pain (C/o upper left arm pain.  Started about 2 wks ago.  Occasionally goes down into elbow.  )   R handed.  2 week h/o L upper arm pain with radiation down elbow, worse with movement of shoulder especially lifting arm above head. Describes sore throbbing ache. No burning or pressure/tightness. Denies inciting trauma/injury or falls. Denies numbness, paresthesias or waekness of arm. No neck pain. No chest pain, GERD. Denies redness, swelling or warmth of the affected arm. No new exercise routine. Symptoms can wake her up at night.   Hasn't tried anything for this yet.   Received COVID vaccine Moderna 11/2019, 12/21/2019. Did well with this.      Relevant past medical, surgical, family and social history reviewed and updated as indicated. Interim medical history since our last visit reviewed. Allergies and medications reviewed and updated. Outpatient Medications Prior to Visit  Medication Sig Dispense Refill  . Ascorbic Acid (VITAMIN C PO) Take 1 tablet by mouth daily.    Marland Kitchen BIOTIN FORTE PO Take 1 tablet by mouth daily.      . cetirizine (ZYRTEC) 10 MG tablet Take 10 mg by mouth daily as needed.     . Multiple Vitamin (MULTIVITAMIN) capsule Take 1 capsule by mouth daily.      Marland Kitchen VITAMIN D PO Take 1 capsule by mouth daily.    . vitamin E 400 UNIT capsule Take 400 Units by mouth daily.       No facility-administered medications prior to visit.     Per HPI unless specifically indicated in ROS section below Review of Systems Objective:  BP 120/76 (BP  Location: Left Arm, Patient Position: Sitting, Cuff Size: Large)   Pulse 72   Temp 98.2 F (36.8 C) (Temporal)   Ht 5' (1.524 m)   Wt 196 lb (88.9 kg)   LMP 12/07/2010   SpO2 97%   BMI 38.28 kg/m   Wt Readings from Last 3 Encounters:  02/23/20 196 lb (88.9 kg)  11/22/19 197 lb 5 oz (89.5 kg)  02/19/18 214 lb (97.1 kg)      Physical Exam Vitals and nursing note reviewed.  Constitutional:      Appearance: Normal appearance. She is not ill-appearing.  Neck:     Comments: No midline cervical spine tenderness or paracervical mm tenderness. FROM at cervical neck Musculoskeletal:        General: Tenderness present. No swelling or deformity. Normal range of motion.     Cervical back: Normal range of motion and neck supple. No rigidity.     Comments:  R shoulder WNL L shoulder exam: No deformity of shoulders on inspection. + pain with palpation of posterior shoulder at bursa and lateral upper arm  FROM in abduction and forward flexion, uncomfortable. + pain/weakness with testing SITS in int rotation. ++ pain with empty can sign. + pain with Speed test. No impingement. No pain with crossover test.  No pain with rotation of humeral head in [facility] joint.   Lymphadenopathy:     Cervical: No cervical adenopathy.  Skin:    General: Skin is warm and dry.     Findings: No erythema or rash.  Neurological:     Mental Status: She is alert.  Psychiatric:        Mood and Affect: Mood normal.        Behavior: Behavior normal.        Assessment & Plan:  This visit occurred during the SARS-CoV-2 public health emergency.  Safety protocols were in place, including screening questions prior to the visit, additional usage of staff PPE, and extensive cleaning of exam room while observing appropriate contact time as indicated for disinfecting solutions.   Problem List Items Addressed This Visit    Acute pain of left shoulder    Anticipate combination of RTC tendonitis (supraspinatus, teres major)  and shoulder bursitis. Treat conservatively with voltaren gel, naprosyn 375mg  course, ice/heat, and provided with exercises from [facility] pt advisor for both. If not improved, update [place] for PT vs SM eval. Pt agrees with plan.           Meds ordered this encounter  Medications  . naproxen (NAPROSYN) 375 MG tablet    Sig: Take 1 tablet (375 mg total) by mouth 2 (two) times daily with a meal. For 5-7 days then as needed    Dispense:  30 tablet    Refill:  0   No orders of the defined types were placed in this encounter.   Patient Instructions  I think you have left rotator cuff inflammation/tendonitis as well as shoulder bursitis.  Treat with naprosyn 375mg  twice daily with meals for 5-7 days. Use OTC voltaren (diclofenac) gel topically three times a day to left shoulder.  Do exercises provided today for rotator cuff injury.  If not improving with this, either let us know for PT course or schedule follow up with Dr Lorelei Pont our sports medicine doctor to consider steroid shot into shoulder.    Follow up plan: Return if symptoms worsen or fail to improve.  Ria Bush, MD

## 2020-02-23 NOTE — Patient Instructions (Addendum)
I think you have left rotator cuff inflammation/tendonitis as well as shoulder bursitis.  Treat with naprosyn 375mg  twice daily with meals for 5-7 days. Use OTC voltaren (diclofenac) gel topically three times a day to left shoulder.  Do exercises provided today for rotator cuff injury.  If not improving with this, either let us know for PT course or schedule follow up with Dr Lorelei Pont our sports medicine doctor to consider steroid shot into shoulder.

## 2020-02-23 NOTE — Assessment & Plan Note (Signed)
Anticipate combination of RTC tendonitis (supraspinatus, teres major) and shoulder bursitis. Treat conservatively with voltaren gel, naprosyn 375mg  course, ice/heat, and provided with exercises from [facility] pt advisor for both. If not improved, update [place] for PT vs SM eval. Pt agrees with plan.

## 2020-08-30 ENCOUNTER — Ambulatory Visit: Payer: BC Managed Care – PPO | Attending: Family

## 2020-08-30 DIAGNOSIS — Z23 Encounter for immunization: Secondary | ICD-10-CM

## 2020-12-06 ENCOUNTER — Other Ambulatory Visit: Payer: Self-pay | Admitting: Family Medicine

## 2020-12-06 DIAGNOSIS — Z1231 Encounter for screening mammogram for malignant neoplasm of breast: Secondary | ICD-10-CM

## 2020-12-07 ENCOUNTER — Ambulatory Visit
Admission: RE | Admit: 2020-12-07 | Discharge: 2020-12-07 | Disposition: A | Discharge status: Home / Self Care | Payer: BC Managed Care – PPO | Source: Ambulatory Visit | Attending: Family Medicine | Admitting: Family Medicine

## 2020-12-07 ENCOUNTER — Other Ambulatory Visit: Payer: Self-pay

## 2020-12-07 DIAGNOSIS — Z1231 Encounter for screening mammogram for malignant neoplasm of breast: Secondary | ICD-10-CM

## 2020-12-08 ENCOUNTER — Ambulatory Visit (INDEPENDENT_AMBULATORY_CARE_PROVIDER_SITE_OTHER): Payer: BC Managed Care – PPO | Admitting: Internal Medicine

## 2020-12-08 ENCOUNTER — Encounter: Payer: Self-pay | Admitting: Internal Medicine

## 2020-12-08 ENCOUNTER — Other Ambulatory Visit: Payer: Self-pay

## 2020-12-08 VITALS — BP 110/74 | HR 92 | Temp 97.6°F | Ht 60.0 in | Wt 194.0 lb

## 2020-12-08 DIAGNOSIS — M25551 Pain in right hip: Secondary | ICD-10-CM | POA: Insufficient documentation

## 2020-12-08 NOTE — Patient Instructions (Signed)
If you get worse, I would set you up with an orthopedist. I recommend ice to the painful area and the ibuprofen 2 tabs--up to 3 times a day  Hip Bursitis  Hip bursitis is swelling of one or more fluid-filled sacs (bursae) in your hip joint. This condition can cause pain, and your symptoms may come and go over time. What are the causes?  Repeated use of your hip muscles.  Injury to the hip.  Weak butt muscles.  Bone spurs.  Infection. In some cases, the cause may not be known. What increases the risk? You are more likely to develop this condition if:  You had a past hip injury or hip surgery.  You have a condition, such as arthritis, gout, diabetes, or thyroid disease.  You have spine problems.  You have one leg that is shorter than the other.  You run a lot or do long-distance running.  You play sports where there is a risk of injury or falling, such as football, martial arts, or skiing. What are the signs or symptoms? Symptoms may come and go, and they often include:  Pain in the hip or groin area. Pain may get worse when you move your hip.  Tenderness and swelling of the hip. In rare cases, the bursa may become infected. If this happens, you may get a fever, as well as have warmth and redness in the hip area. How is this treated? This condition is treated by:  Resting your hip.  Icing your hip.  Wrapping the hip area with an elastic bandage (compression wrap).  Keeping the hip raised. Other treatments may include medicine, draining fluid out of the bursa, or using crutches, a cane, or a walker. Surgery may be needed, but this is rare. Long-term treatment may include doing exercises to help your strength and flexibility. It may also include lifestyle changes like losing weight to lessen the strain on your hip. Follow these instructions at home: Managing pain, stiffness, and swelling  If told, put ice on the painful area. ? Put ice in a plastic bag. ? Place a  towel between your skin and the bag. ? Leave the ice on for 20 minutes, 2-3 times a day.  Raise your hip by putting a pillow under your hips while you lie down. Stop if you feel pain.  If told, put heat on the affected area. Do this as often as told by your doctor. Use a moist heat pack or a heating pad as told by your doctor. ? Place a towel between your skin and the heat source. ? Leave the heat on for 20-30 minutes. ? Take off the heat if your skin turns bright red. This is very important if you are unable to feel pain, heat, or cold. You may have a greater risk of getting burned.      Activity  Do not use your hip to support your body weight until your doctor says that you can.  Use crutches, a cane, or a walker as told by your doctor.  If the affected leg is one that you use to drive, ask your doctor if it is safe to drive.  Rest and protect your hip as much as you can until you feel better.  Return to your normal activities as told by your doctor. Ask your doctor what activities are safe for you.  Do exercises as told by your doctor. General instructions  Take over-the-counter and prescription medicines only as told by your doctor.  Gently rub and stretch your injured area as often as is comfortable.  Wear elastic bandages only as told by your doctor.  If one of your legs is shorter than the other, get fitted for a shoe insert or orthotic.  Keep a healthy weight. Follow instructions from your doctor.  Keep all follow-up visits as told by your doctor. This is important. How is this prevented?  Exercise regularly, as told by your doctor.  Wear the right shoes for the sport you play.  Warm up and stretch before being active. Cool down and stretch after being active.  Take breaks often from repeated activity.  Avoid activities that bother your hip or cause pain.  Avoid sitting down for a long time. Where to find more information  American Academy [employer] Orthopa[employer]  [employer]geons: orthoinfo.aaos.org Contact a doctor if:  You have a fever.  You have new symptoms.  You have trouble walking or doing everyday activities.  You have pain that gets worse or does not get better with medicine.  Your skin around your hip is red.  You get a feeling of warmth in your hip area. Get help right away if:  You cannot move your hip.  You have very bad pain.  You cannot control the muscles in your feet. Summary  Hip bursitis is swelling of one or more fluid-filled sacs (bursae) in your hip joint.  Symptoms often come and go over time.  This condition is often treated by resting and icing the hip. It also may help to keep the area raised and wrapped in an elastic bandage. Other treatments may be needed. This information is not intended to replace advice given to you by your health care provider. Make sure you discuss any questions you have with your health care provider. Document Revised: 06/28/2019 Document Reviewed: 05/04/2018 Elsevier Patient Education  2021 [employer].

## 2020-12-08 NOTE — Progress Notes (Signed)
Subjective:    Patient ID: Isabella Ramirez, female    DOB: 03-30-1956, 65 y.o.   MRN: [id number]  HPI Here due to right hip and leg pain This visit occurred during the SARS-CoV-2 public health emergency.  Safety protocols were in place, including screening questions prior to the visit, additional usage of staff PPE, and extensive cleaning of exam room while observing appropriate contact time as indicated for disinfecting solutions.   Wonders if she overdid something Started with pain over upper lateral right hip--about a week ago Now moves down lateral leg More aching last night and this morning Notes decreased sensation in lateral thigh  Did hurt more with movement--like putting on pants Some pain when lying on it  No back pain Can feel it going up steps Notices it with walking---may worsen as the day goes on  Tried some ibuprofen 400mg ---eased the pain Arthritis cream no help  Current Outpatient Medications on File Prior to Visit  Medication Sig Dispense Refill  . Ascorbic Acid (VITAMIN C PO) Take 1 tablet by mouth daily.    Marland Kitchen BIOTIN FORTE PO Take 1 tablet by mouth daily.    . cetirizine (ZYRTEC) 10 MG tablet Take 10 mg by mouth daily as needed.    . Multiple Vitamin (MULTIVITAMIN) capsule Take 1 capsule by mouth daily.    . naproxen (NAPROSYN) 375 MG tablet Take 1 tablet (375 mg total) by mouth 2 (two) times daily with a meal. For 5-7 days then as needed 30 tablet 0  . VITAMIN D PO Take 1 capsule by mouth daily.    . vitamin E 400 UNIT capsule Take 400 Units by mouth daily.     No current facility-administered medications on file prior to visit.    Allergies  Allergen Reactions  . Shellfish Allergy Hives and Itching    Past Medical History:  Diagnosis Date  . Allergy    allergic rhinitis  . Asthma    as a child  . Ovarian cyst   . Uterine fibroids affecting pregnancy     Past Surgical History:  Procedure Laterality Date  . ABDOMINAL HYSTERECTOMY N/A  06/27/2014   Procedure: HYSTERECTOMY ABDOMINAL WITH BILATERAL SALPINGO OOPHORECTOMY;  Surgeon: Linda Hedges, DO;  Location: [facility];  Service: Gynecology;  Laterality: N/A;  . Ramirez  01/2005  . POPLITEAL SYNOVIAL CYST EXCISION    . TUBAL LIGATION      Family History  Problem Relation Age of Onset  . Heart disease Mother   . Hypertension Mother     Social History   Socioeconomic History  . Marital status: Married    Spouse name: Not on file  . Number of children: Not on file  . Years of education: Not on file  . Highest education level: Not on file  Occupational History  . Not on file  Tobacco Use  . Smoking status: Never Smoker  . Smokeless tobacco: Never Used  Substance and Sexual Activity  . Alcohol use: Yes    Alcohol/week: 0.0 standard drinks    Comment: occ wine  . Drug use: No  . Sexual activity: Not on file  Other Topics Concern  . Not on file  Social History Narrative  . Not on file   Social Determinants of Health   Financial Resource Strain: Not on file  Food Insecurity: Not on file  Transportation Needs: Not on file  Physical Activity: Not on file  Stress: Not on file  Social Connections: Not on file  Intimate Partner Violence: Not on file   Review of Systems No leg weakness No fever No illness    Objective:   Physical Exam Musculoskeletal:     Comments: No back tenderness Fairly normal ROM in both hips SLR negative Some localized tenderness by greater trochanter  Neurological:     Comments: Normal gait No leg weakness Sensation seems normal in lateral thighs now            Assessment & Plan:

## 2020-12-08 NOTE — Assessment & Plan Note (Signed)
I think this is likely trochanteric bursitis No evidence of spine or true hip pathology Discussed ice/ibuprofen Ortho eval if worsens

## 2020-12-20 ENCOUNTER — Telehealth (INDEPENDENT_AMBULATORY_CARE_PROVIDER_SITE_OTHER): Payer: BC Managed Care – PPO | Admitting: Family Medicine

## 2020-12-20 ENCOUNTER — Other Ambulatory Visit: Payer: Self-pay

## 2020-12-20 ENCOUNTER — Other Ambulatory Visit (INDEPENDENT_AMBULATORY_CARE_PROVIDER_SITE_OTHER): Payer: BC Managed Care – PPO

## 2020-12-20 DIAGNOSIS — R7303 Prediabetes: Secondary | ICD-10-CM

## 2020-12-20 DIAGNOSIS — Z0000 Encounter for general adult medical examination without abnormal findings: Secondary | ICD-10-CM | POA: Diagnosis not present

## 2020-12-20 LAB — CBC WITH DIFFERENTIAL/PLATELET
Basophils Absolute: 0 10*3/uL (ref 0.0–0.1)
Basophils Relative: 1.2 % (ref 0.0–3.0)
Eosinophils Absolute: 0.1 10*3/uL (ref 0.0–0.7)
Eosinophils Relative: 3.2 % (ref 0.0–5.0)
HCT: 41.1 % (ref 36.0–46.0)
Hemoglobin: 13.5 g/dL (ref 12.0–15.0)
Lymphocytes Relative: 32 % (ref 12.0–46.0)
Lymphs Abs: 1.1 10*3/uL (ref 0.7–4.0)
MCHC: 32.7 g/dL (ref 30.0–36.0)
MCV: 91.2 fl (ref 78.0–100.0)
Monocytes Absolute: 0.3 10*3/uL (ref 0.1–1.0)
Monocytes Relative: 9.3 % (ref 3.0–12.0)
Neutro Abs: 1.8 10*3/uL (ref 1.4–7.7)
Neutrophils Relative %: 54.3 % (ref 43.0–77.0)
Platelets: 321 10*3/uL (ref 150.0–400.0)
RBC: 4.51 Mil/uL (ref 3.87–5.11)
RDW: 12.8 % (ref 11.5–15.5)
WBC: 3.4 10*3/uL — ABNORMAL LOW (ref 4.0–10.5)

## 2020-12-20 LAB — TSH: TSH: 1.45 u[IU]/mL (ref 0.35–4.50)

## 2020-12-20 LAB — COMPREHENSIVE METABOLIC PANEL
ALT: 19 U/L (ref 0–35)
AST: 19 U/L (ref 0–37)
Albumin: 3.9 g/dL (ref 3.5–5.2)
Alkaline Phosphatase: 65 U/L (ref 39–117)
BUN: 12 mg/dL (ref 6–23)
CO2: 32 mEq/L (ref 19–32)
Calcium: 9.4 mg/dL (ref 8.4–10.5)
Chloride: 102 mEq/L (ref 96–112)
Creatinine, Ser: 0.69 mg/dL (ref 0.40–1.20)
GFR: 92.46 mL/min (ref >60.00)
Glucose, Bld: 84 mg/dL (ref 70–99)
Potassium: 4.3 mEq/L (ref 3.5–5.1)
Sodium: 140 mEq/L (ref 135–145)
Total Bilirubin: 0.4 mg/dL (ref 0.2–1.2)
Total Protein: 6.6 g/dL (ref 6.0–8.3)

## 2020-12-20 LAB — LIPID PANEL
Cholesterol: 174 mg/dL (ref 0–200)
HDL: 65.4 mg/dL (ref >39.00)
LDL Cholesterol: 95 mg/dL (ref 0–99)
NonHDL: 108.73
Total CHOL/HDL Ratio: 3
Triglycerides: 68 mg/dL (ref 0.0–149.0)
VLDL: 13.6 mg/dL (ref 0.0–40.0)

## 2020-12-20 LAB — HEMOGLOBIN A1C: Hgb A1c MFr Bld: 6 % (ref 4.6–6.5)

## 2020-12-20 NOTE — Telephone Encounter (Signed)
-----   Message from Ellamae Sia sent at 12/20/2020  9:10 AM EDT ----- Regarding: Lab orders for now Patient is scheduled for CPX labs, please order future labs, Thanks , Karna Christmas

## 2020-12-21 ENCOUNTER — Encounter: Payer: Self-pay | Admitting: *Deleted

## 2021-01-09 ENCOUNTER — Encounter: Payer: Self-pay | Admitting: Family Medicine

## 2021-01-09 ENCOUNTER — Other Ambulatory Visit: Payer: Self-pay

## 2021-01-09 ENCOUNTER — Ambulatory Visit (INDEPENDENT_AMBULATORY_CARE_PROVIDER_SITE_OTHER): Payer: BC Managed Care – PPO | Admitting: Family Medicine

## 2021-01-09 VITALS — BP 112/68 | HR 78 | Temp 97.0°F | Ht 60.25 in | Wt 195.2 lb

## 2021-01-09 DIAGNOSIS — E6609 Other obesity due to excess calories: Secondary | ICD-10-CM | POA: Insufficient documentation

## 2021-01-09 DIAGNOSIS — R7303 Prediabetes: Secondary | ICD-10-CM

## 2021-01-09 DIAGNOSIS — Z0000 Encounter for general adult medical examination without abnormal findings: Secondary | ICD-10-CM

## 2021-01-09 DIAGNOSIS — Z6837 Body mass index (BMI) 37.0-37.9, adult: Secondary | ICD-10-CM | POA: Diagnosis not present

## 2021-01-09 NOTE — Assessment & Plan Note (Signed)
Discussed how this problem influences overall health and the risks it imposes  Reviewed plan for weight loss with lower calorie diet (via better food choices and also portion control or program like weight watchers) and exercise building up to or more than 30 minutes 5 days per week including some aerobic activity    

## 2021-01-09 NOTE — Patient Instructions (Addendum)
Get back to exercise when you can   If you are interested in the shingles vaccine series (Shingrix), call your insurance or pharmacy to check on coverage and location it must be given.  If affordable - you can schedule it here or at your pharmacy depending on coverage   Schedule your colonoscopy when you are ready   To prevent diabetes  Try to get most of your carbohydrates from produce (with the exception of white potatoes)  Eat less bread/pasta/rice/snack foods/cereals/sweets and other items from the middle of the grocery store (processed carbs)  Keep taking care of yourself

## 2021-01-09 NOTE — Assessment & Plan Note (Addendum)
Reviewed health habits including diet and exercise and skin cancer prevention Reviewed appropriate screening tests for age  Also reviewed health mt list, fam hx and immunization status , as well as social and family history   See HPI Labs reviewed  Enc flu shot every fall  Lipid panel is good with current diet  covid vaccinated  Due for colonoscopy -she will call to schedule herself  Mammogram utd  Interested in shingrix vaccine if covered

## 2021-01-09 NOTE — Assessment & Plan Note (Signed)
Lab Results  Component Value Date   HGBA1C 6.0 12/20/2020   disc imp of low glycemic diet and wt loss to prevent DM2

## 2021-01-09 NOTE — Progress Notes (Signed)
Subjective:    Patient ID: Isabella Ramirez, female    DOB: [birth year], 65 y.o.   MRN: [id number]  This visit occurred during the SARS-CoV-2 public health emergency.  Safety protocols were in place, including screening questions prior to the visit, additional usage of staff PPE, and extensive cleaning of exam room while observing appropriate contact time as indicated for disinfecting solutions.    HPI  Here for health maintenance exam and to review chronic medical problems    Wt Readings from Last 3 Encounters:  01/09/21 195 lb 4 oz (88.6 kg)  12/08/20 194 lb (88 kg)  02/23/20 196 lb (88.9 kg)   37.82 kg/m   Working and getting out  (work is good)  Lost husband and adjusting  Getting out and doing things that she likes  Quiet - some days too much , keeps music going  Eating well  Good self care  Took a trip with a friend - enjoyed that    Loosing inches instead of pounds   Needs to exercise more /was walking  Wants to go back to water aerobics   Tdap 12/18 Flu shot -did not get this fall  covid vaccinated with booster  Zoster status - interested in shingrix if covered   Hysterectomy in the past   Colonoscopy 3/11 She will need a driver and then plans to get it   Mammogram 3/22 Self breast exam -no lumps   BP Readings from Last 3 Encounters:  01/09/21 112/68  12/08/20 110/74  02/23/20 120/76   Pulse Readings from Last 3 Encounters:  01/09/21 78  12/08/20 92  02/23/20 72   Prediabetes Lab Results  Component Value Date   HGBA1C 6.0 12/20/2020  last time 5.8  Mindful of diet  Cut out a lot of sugar so far/no soda  Plans to exercise more   Cholesterol Lab Results  Component Value Date   CHOL 174 12/20/2020   CHOL 180 11/11/2019   CHOL 163 08/22/2017   Lab Results  Component Value Date   HDL 65.40 12/20/2020   HDL 61.50 11/11/2019   HDL 67.80 08/22/2017   Lab Results  Component Value Date   LDLCALC 95 12/20/2020   LDLCALC 105 (H) 11/11/2019    LDLCALC 86 08/22/2017   Lab Results  Component Value Date   TRIG 68.0 12/20/2020   TRIG 69.0 11/11/2019   TRIG 45.0 08/22/2017   Lab Results  Component Value Date   CHOLHDL 3 12/20/2020   CHOLHDL 3 11/11/2019   CHOLHDL 2 08/22/2017   No results found for: LDLDIRECT  Lab Results  Component Value Date   WBC 3.4 (L) 12/20/2020   HGB 13.5 12/20/2020   HCT 41.1 12/20/2020   MCV 91.2 12/20/2020   PLT 321.0 12/20/2020   Lab Results  Component Value Date   CREATININE 0.69 12/20/2020   BUN 12 12/20/2020   NA 140 12/20/2020   K 4.3 12/20/2020   CL 102 12/20/2020   CO2 32 12/20/2020   Lab Results  Component Value Date   ALT 19 12/20/2020   AST 19 12/20/2020   ALKPHOS 65 12/20/2020   BILITOT 0.4 12/20/2020   Lab Results  Component Value Date   TSH 1.45 12/20/2020    Patient Active Problem List   Diagnosis Date Noted  . Right hip pain 12/08/2020  . Grief reaction 11/22/2019  . Prediabetes 10/10/2014  . S/P hysterectomy 06/27/2014  . Stress reaction 05/04/2014  . Routine general medical examination at a health  care facility 02/15/2011  . Routine gynecological examination 02/15/2011  . Other screening mammogram 02/15/2011  . ALLERGIC RHINITIS 04/23/2007  . ASTHMA 04/23/2007   Past Medical History:  Diagnosis Date  . Allergy    allergic rhinitis  . Asthma    as a child  . Ovarian cyst   . Uterine fibroids affecting pregnancy    Past Surgical History:  Procedure Laterality Date  . ABDOMINAL HYSTERECTOMY N/A 06/27/2014   Procedure: HYSTERECTOMY ABDOMINAL WITH BILATERAL SALPINGO OOPHORECTOMY;  Surgeon: Linda Hedges, DO;  Location: [facility];  Service: Gynecology;  Laterality: N/A;  . Ramirez  01/2005  . POPLITEAL SYNOVIAL CYST EXCISION    . TUBAL LIGATION     Social History   Tobacco Use  . Smoking status: Never Smoker  . Smokeless tobacco: Never Used  Substance Use Topics  . Alcohol use: Yes    Alcohol/week: 0.0 standard drinks    Comment: occ wine  .  Drug use: No   Family History  Problem Relation Age of Onset  . Heart disease Mother   . Hypertension Mother    Allergies  Allergen Reactions  . Shellfish Allergy Hives and Itching   Current Outpatient Medications on File Prior to Visit  Medication Sig Dispense Refill  . Ascorbic Acid (VITAMIN C PO) Take 1 tablet by mouth daily.    Marland Kitchen BIOTIN FORTE PO Take 1 tablet by mouth daily.    . cetirizine (ZYRTEC) 10 MG tablet Take 10 mg by mouth daily as needed.    . Multiple Vitamin (MULTIVITAMIN) capsule Take 1 capsule by mouth daily.    . naproxen (NAPROSYN) 375 MG tablet Take 1 tablet (375 mg total) by mouth 2 (two) times daily with a meal. For 5-7 days then as needed (Patient taking differently: Take 375 mg by mouth daily as needed.) 30 tablet 0  . VITAMIN D PO Take 1 capsule by mouth daily.    . vitamin E 400 UNIT capsule Take 400 Units by mouth daily.     No current facility-administered medications on file prior to visit.     Review of Systems  Constitutional: Negative for activity change, appetite change, fatigue, fever and unexpected weight change.  HENT: Negative for congestion, ear pain, rhinorrhea, sinus pressure and sore throat.   Eyes: Negative for pain, redness and visual disturbance.  Respiratory: Negative for cough, shortness of breath and wheezing.   Cardiovascular: Negative for chest pain and palpitations.  Gastrointestinal: Negative for abdominal pain, blood in stool, constipation and diarrhea.  Endocrine: Negative for polydipsia and polyuria.  Genitourinary: Negative for dysuria, frequency and urgency.  Musculoskeletal: Negative for arthralgias, back pain and myalgias.  Skin: Negative for pallor and rash.  Allergic/Immunologic: Negative for environmental allergies.  Neurological: Negative for dizziness, syncope and headaches.  Hematological: Negative for adenopathy. Does not bruise/bleed easily.  Psychiatric/Behavioral: Negative for decreased concentration and  dysphoric mood. The patient is not nervous/anxious.        Dealing ok with grief so far after loss of husband       Objective:   Physical Exam Constitutional:      General: She is not in acute distress.    Appearance: Normal appearance. She is well-developed. She is obese. She is not ill-appearing or diaphoretic.  HENT:     Head: Normocephalic and atraumatic.     Right Ear: Tympanic membrane, ear canal and external ear normal.     Left Ear: Tympanic membrane, ear canal and external ear normal.  Nose: Nose normal. No congestion.     Mouth/Throat:     Mouth: Mucous membranes are moist.     Pharynx: Oropharynx is clear. No posterior oropharyngeal erythema.  Eyes:     General: No scleral icterus.    Extraocular Movements: Extraocular movements intact.     Conjunctiva/sclera: Conjunctivae normal.     Pupils: Pupils are equal, round, and reactive to light.  Neck:     Thyroid: No thyromegaly.     Vascular: No carotid bruit or JVD.  Cardiovascular:     Rate and Rhythm: Normal rate and regular rhythm.     Pulses: Normal pulses.     Heart sounds: Normal heart sounds. No gallop.   Pulmonary:     Effort: Pulmonary effort is normal. No respiratory distress.     Breath sounds: Normal breath sounds. No wheezing.     Comments: Good air exch Chest:     Chest wall: No tenderness.  Abdominal:     General: Bowel sounds are normal. There is no distension or abdominal bruit.     Palpations: Abdomen is soft. There is no mass.     Tenderness: There is no abdominal tenderness.     Hernia: No hernia is present.  Genitourinary:    Comments: Breast exam: No mass, nodules, thickening, tenderness, bulging, retraction, inflamation, nipple discharge or skin changes noted.  No axillary or clavicular LA.     Musculoskeletal:        General: No tenderness. Normal range of motion.     Cervical back: Normal range of motion and neck supple. No rigidity. No muscular tenderness.     Right lower leg: No  edema.     Left lower leg: No edema.  Lymphadenopathy:     Cervical: No cervical adenopathy.  Skin:    General: Skin is warm and dry.     Coloration: Skin is not pale.     Findings: No erythema or rash.     Comments: Few lentigines  Neurological:     Mental Status: She is alert. Mental status is at baseline.     Cranial Nerves: No cranial nerve deficit.     Motor: No abnormal muscle tone.     Coordination: Coordination normal.     Gait: Gait normal.     Deep Tendon Reflexes: Reflexes are normal and symmetric. Reflexes normal.  Psychiatric:        Mood and Affect: Mood normal.        Cognition and Memory: Cognition and memory normal.           Assessment & Plan:   Problem List Items Addressed This Visit      Other   Routine general medical examination at a health care facility - Primary    Reviewed health habits including diet and exercise and skin cancer prevention Reviewed appropriate screening tests for age  Also reviewed health mt list, fam hx and immunization status , as well as social and family history   See HPI Labs reviewed  Enc flu shot every fall  Lipid panel is good with current diet  covid vaccinated  Due for colonoscopy -she will call to schedule herself  Mammogram utd  Interested in shingrix vaccine if covered        Prediabetes    Lab Results  Component Value Date   HGBA1C 6.0 12/20/2020   disc imp of low glycemic diet and wt loss to prevent DM2       BMI 37.0-37.9, adult  Discussed how this problem influences overall health and the risks it imposes  Reviewed plan for weight loss with lower calorie diet (via better food choices and also portion control or program like weight watchers) and exercise building up to or more than 30 minutes 5 days per week including some aerobic activity

## 2021-06-22 ENCOUNTER — Ambulatory Visit: Payer: BC Managed Care – PPO | Admitting: Family Medicine

## 2021-06-22 ENCOUNTER — Other Ambulatory Visit: Payer: Self-pay

## 2021-06-22 ENCOUNTER — Ambulatory Visit (INDEPENDENT_AMBULATORY_CARE_PROVIDER_SITE_OTHER): Payer: BC Managed Care – PPO

## 2021-06-22 ENCOUNTER — Encounter: Payer: Self-pay | Admitting: Family Medicine

## 2021-06-22 DIAGNOSIS — M25562 Pain in left knee: Secondary | ICD-10-CM

## 2021-06-22 NOTE — Patient Instructions (Signed)
Take ibuprofen as needed (always with a full stomach)   Ice when you can for 10 minutes /back of the knee Elevate when able   You can get a knee compression sleeve to wear if it when active as needed   We may get an ultrasound next   If symptoms suddenly worsen let me know

## 2021-06-22 NOTE — Progress Notes (Signed)
Subjective:    Patient ID: Isabella Ramirez, female    DOB: [birth year], 65 y.o.   MRN: [id number]  This visit occurred during the SARS-CoV-2 public health emergency.  Safety protocols were in place, including screening questions prior to the visit, additional usage of staff PPE, and extensive cleaning of exam room while observing appropriate contact time as indicated for disinfecting solutions.   HPI Pt presents with knee complaints  Wants a flu shot today  Wt Readings from Last 3 Encounters:  06/22/21 200 lb (90.7 kg)  01/09/21 195 lb 4 oz (88.6 kg)  12/08/20 194 lb (88 kg)   39.06 kg/m  No known trauma  She has been walking up hill (new job) to get to building   Fine if sitting  Stands for a bit and then starts to feel pain  Walking-will start to hurt eventually  Hurts when it is stretched out at night  Hurts in the back ,not tender /like a burning and then dull ache  Just a little medially   Felt unstable one day when she was walking  One day hurt to get into the car   It has not looked swollen  No redness or heat   Took some ibuprofen-helps a little  No ice or heat   No noise or grind   No regular exercise  Does walk around the block   Tried diclofenac gel-did not help   Feels swelling in the back of the knee   Had L knee MRI years ago 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 Never had surgery for it   Patient Active Problem List   Diagnosis Date Noted   Left knee pain 06/22/2021   BMI 37.0-37.9, adult 01/09/2021   Right hip pain 12/08/2020   Grief reaction 11/22/2019   Prediabetes 10/10/2014   S/P hysterectomy 06/27/2014   Stress reaction 05/04/2014   Routine general medical examination at a health care facility 02/15/2011   Routine gynecological examination 02/15/2011   Other screening mammogram 02/15/2011   ALLERGIC RHINITIS 04/23/2007   ASTHMA 04/23/2007   Past Medical History:  Diagnosis Date   Allergy    allergic  rhinitis   Asthma    as a child   Ovarian cyst    Uterine fibroids affecting pregnancy    Past Surgical History:  Procedure Laterality Date   ABDOMINAL HYSTERECTOMY N/A 06/27/2014   Procedure: HYSTERECTOMY ABDOMINAL WITH BILATERAL SALPINGO OOPHORECTOMY;  Surgeon: Linda Hedges, DO;  Location: [facility];  Service: Gynecology;  Laterality: N/A;   Ramirez  01/2005   POPLITEAL SYNOVIAL CYST EXCISION     TUBAL LIGATION     Social History   Tobacco Use   Smoking status: Never   Smokeless tobacco: Never  Substance Use Topics   Alcohol use: Yes    Alcohol/week: 0.0 standard drinks    Comment: occ wine   Drug use: No   Family History  Problem Relation Age of Onset   Heart disease Mother    Hypertension Mother    Allergies  Allergen Reactions   Shellfish Allergy Hives and Itching   Current Outpatient Medications on File Prior to Visit  Medication Sig Dispense Refill   Ascorbic Acid (VITAMIN C PO) Take 1 tablet by mouth daily.     BIOTIN FORTE PO Take 1 tablet by mouth daily.     cetirizine (ZYRTEC) 10 MG tablet Take 10 mg by mouth daily as needed.     Multiple Vitamin (MULTIVITAMIN) capsule Take  1 capsule by mouth daily.     naproxen (NAPROSYN) 375 MG tablet Take 1 tablet (375 mg total) by mouth 2 (two) times daily with a meal. For 5-7 days then as needed (Patient taking differently: Take 375 mg by mouth daily as needed.) 30 tablet 0   VITAMIN D PO Take 1 capsule by mouth daily.     vitamin E 400 UNIT capsule Take 400 Units by mouth daily.     No current facility-administered medications on file prior to visit.    Review of Systems  Constitutional:  Negative for activity change, appetite change, fatigue, fever and unexpected weight change.  HENT:  Negative for congestion, ear pain, rhinorrhea, sinus pressure and sore throat.   Eyes:  Negative for pain, redness and visual disturbance.  Respiratory:  Negative for cough, shortness of breath and wheezing.   Cardiovascular:  Negative for  chest pain and palpitations.  Gastrointestinal:  Negative for abdominal pain, blood in stool, constipation and diarrhea.  Endocrine: Negative for polydipsia and polyuria.  Genitourinary:  Negative for dysuria, frequency and urgency.  Musculoskeletal:  Negative for arthralgias, back pain and myalgias.       L knee pain   Skin:  Negative for pallor and rash.  Allergic/Immunologic: Negative for environmental allergies.  Neurological:  Negative for dizziness, syncope and headaches.  Hematological:  Negative for adenopathy. Does not bruise/bleed easily.  Psychiatric/Behavioral:  Negative for decreased concentration and dysphoric mood. The patient is not nervous/anxious.       Objective:   Physical Exam Constitutional:      General: She is not in acute distress.    Appearance: Normal appearance. She is obese. She is not ill-appearing.  Eyes:     Conjunctiva/sclera: Conjunctivae normal.     Pupils: Pupils are equal, round, and reactive to light.  Cardiovascular:     Rate and Rhythm: Normal rate and regular rhythm.  Musculoskeletal:        General: No deformity.     Right lower leg: No edema.     Left lower leg: No edema.     Comments: Knee  No swelling or effusion  No warmth to the touch  No crepitus  ROM: full Flex-no pain Ext -no pain  Mcmurray-normal  Bounce test -normal  Stability: Anterior drawer-nl Lachman exam -nl   Tenderness -posterior and slight of lateral joint line   Gait nl     Neurological:     Mental Status: She is alert.     Sensory: No sensory deficit.     Motor: No weakness.     Deep Tendon Reflexes: Reflexes normal.  Psychiatric:        Mood and Affect: Mood normal.          Assessment & Plan:   Problem List Items Addressed This Visit       Other   Left knee pain    With remote h/o baker's cyst in the past  Her pain is mainly posterior so this may be the case again Will check xray today  Consider [place] based on that  Ibuprofen prn   Ice when able/elevated Compression sleeve if helpful  inst to call if symptoms worsen in the interim       Relevant Orders   DG Knee 4 Views W/Patella Left

## 2021-06-22 NOTE — Assessment & Plan Note (Signed)
With remote h/o baker's cyst in the past  Her pain is mainly posterior so this may be the case again Will check xray today  Consider [place] based on that  Ibuprofen prn  Ice when able/elevated Compression sleeve if helpful  inst to call if symptoms worsen in the interim

## 2021-06-26 ENCOUNTER — Telehealth: Payer: Self-pay | Admitting: Family Medicine

## 2021-06-26 DIAGNOSIS — M25562 Pain in left knee: Secondary | ICD-10-CM

## 2021-06-26 NOTE — Telephone Encounter (Signed)
Referral is done

## 2021-06-26 NOTE — Telephone Encounter (Signed)
-----   Message from [place] sent at 06/26/2021  3:20 PM EDT ----- Patient advised. Patient would like to see someone in [place]. No particular practice. Whoever Dr Glori Bickers suggests.

## 2021-07-05 ENCOUNTER — Other Ambulatory Visit: Payer: Self-pay

## 2021-07-05 ENCOUNTER — Encounter: Payer: Self-pay | Admitting: Orthopaedic Surgery

## 2021-07-05 ENCOUNTER — Ambulatory Visit: Payer: BC Managed Care – PPO | Admitting: Orthopaedic Surgery

## 2021-07-05 VITALS — Ht 61.0 in | Wt 196.0 lb

## 2021-07-05 DIAGNOSIS — M1712 Unilateral primary osteoarthritis, left knee: Secondary | ICD-10-CM | POA: Diagnosis not present

## 2021-07-05 DIAGNOSIS — M25562 Pain in left knee: Secondary | ICD-10-CM

## 2021-07-05 NOTE — Progress Notes (Signed)
Office Visit Note   Patient: Isabella Ramirez           Date of Birth: [birth year]           MRN: [id number] Visit Date: 07/05/2021              Requested by: Tower, Wynelle Fanny, MD [place],  [place] [address] PCP: Abner Greenspan, MD   Assessment & Plan: Visit Diagnoses:  1. Unilateral primary osteoarthritis, left knee     Plan: Impression is left knee advanced degenerative joint disease.  Today, we discussed cortisone injection, viscosupplementation injection as well as total knee arthroplasty.  She does not believe she has undergone cortisone injection in the past and would like to try this as she is currently not interested in surgery.  Should her symptoms not improve following the cortisone injection, she will call and we will get approval for viscosupplementation injection.  Otherwise, follow-up with [place] as needed.  Follow-Up Instructions: Return if symptoms worsen or fail to improve.   Orders:  Orders Placed This Encounter  Procedures   Large Joint Inj    No orders of the defined types were placed in this encounter.     Procedures: Large Joint Inj: L knee on 07/05/2021 9:02 AM Indications: pain Details: 22 G needle, anterolateral approach Medications: 2 mL lidocaine 1 %; 2 mL bupivacaine 0.25 %; 40 mg methylPREDNISolone acetate 40 MG/ML     Clinical Data: No additional findings.   Subjective: Chief Complaint  Patient presents with   Left Knee - Pain    HPI patient is a pleasant 65 year old female who comes in today with left knee pain for the past month.  No known injury or change in activity.  Pain she has is to the posterior medial aspect.  She notes occasional locking and instability.  The pain appears to be worse when sitting for too long or occasionally with walking and at nighttime.  She takes ibuprofen and uses topical anti-inflammatories without relief.  She notes that she had this knee aspirated 10 to 15 years ago but does not think she  underwent cortisone injection.  Review of Systems as detailed in HPI.  All others reviewed and are negative.   Objective: Vital Signs: Ht 5\' 1"  (1.549 m)   Wt 196 lb (88.9 kg)   LMP 12/07/2010   BMI 37.03 kg/m   Physical Exam well-developed well-nourished female no acute distress.  Alert and oriented x3  Ortho Exam left knee exam shows trace effusion.  Valgus deformity.  Range of motion 0 to 95 degrees.  Medial joint line tenderness.  Ligaments are stable.  She is neurovascular intact distally.  Specialty Comments:  No specialty comments available.  Imaging: X-rays reviewed by me in canopy reveal valgus deformity.  Advance tricompartmental degenerative changes.   PMFS History: Patient Active Problem List   Diagnosis Date Noted   Left knee pain 06/22/2021   BMI 37.0-37.9, adult 01/09/2021   Right hip pain 12/08/2020   Grief reaction 11/22/2019   Prediabetes 10/10/2014   S/P hysterectomy 06/27/2014   Stress reaction 05/04/2014   Routine general medical examination at a health care facility 02/15/2011   Routine gynecological examination 02/15/2011   Other screening mammogram 02/15/2011   ALLERGIC RHINITIS 04/23/2007   ASTHMA 04/23/2007   Past Medical History:  Diagnosis Date   Allergy    allergic rhinitis   Asthma    as a child   Ovarian cyst  Uterine fibroids affecting pregnancy     Family History  Problem Relation Age of Onset   Heart disease Mother    Hypertension Mother     Past Surgical History:  Procedure Laterality Date   ABDOMINAL HYSTERECTOMY N/A 06/27/2014   Procedure: HYSTERECTOMY ABDOMINAL WITH BILATERAL SALPINGO OOPHORECTOMY;  Surgeon: Linda Hedges, DO;  Location: [facility];  Service: Gynecology;  Laterality: N/A;   LEEP  01/2005   POPLITEAL SYNOVIAL CYST EXCISION     TUBAL LIGATION     Social History   Occupational History   Not on file  Tobacco Use   Smoking status: Never   Smokeless tobacco: Never  Substance and Sexual Activity    Alcohol use: Yes    Alcohol/week: 0.0 standard drinks    Comment: occ wine   Drug use: No   Sexual activity: Not on file

## 2021-07-06 MED ORDER — METHYLPREDNISOLONE ACETATE 40 MG/ML IJ SUSP
40.0000 mg | INTRAMUSCULAR | Status: AC | PRN
  Administered 2021-07-05: 40 mg via INTRA_ARTICULAR

## 2021-07-06 MED ORDER — BUPIVACAINE HCL 0.25 % IJ SOLN
2.0000 mL | INTRAMUSCULAR | Status: AC | PRN
  Administered 2021-07-05: 2 mL via INTRA_ARTICULAR

## 2021-07-06 MED ORDER — LIDOCAINE HCL 1 % IJ SOLN
2.0000 mL | INTRAMUSCULAR | Status: AC | PRN
  Administered 2021-07-05: 2 mL

## 2022-04-22 ENCOUNTER — Ambulatory Visit: Payer: BC Managed Care – PPO | Admitting: Family Medicine

## 2022-04-22 ENCOUNTER — Encounter: Payer: Self-pay | Admitting: Family Medicine

## 2022-04-22 VITALS — BP 112/70 | HR 94 | Ht 61.0 in | Wt 194.2 lb

## 2022-04-22 DIAGNOSIS — F430 Acute stress reaction: Secondary | ICD-10-CM

## 2022-04-22 NOTE — Progress Notes (Signed)
Subjective:    Patient ID: Isabella Ramirez, female    DOB: 09-25-1955, 66 y.o.   MRN: [id number]  HPI Pt presents to discuss anxiety symptoms   Wt Readings from Last 3 Encounters:  04/22/22 194 lb 3.2 oz (88.1 kg)  07/05/21 196 lb (88.9 kg)  06/22/21 200 lb (90.7 kg)   36.69 kg/m  Work is very stressful   She changed jobs  The culture is different  (but she likes the work)  No "team" like she had in the past  Feels like her work partner is cold and wants her to fail  Not a lot of support    Wants to stay in [facility] system   Close to retire (turns 31 in January) -considering that option    Issues at work  Worried about keeping her job   Not much appetite  Tearful at time  Has to gather herself at work at times  Makes is hard to concentrate  Hopeless several days    Took off a day to sleep  Feels anxious much of the time for 2 weeks   Wakes up at night thinking about it  Starts to feel dread first thing in the am   Some caffeine - 2 cups of tea   Not suicidal (at all)   Lost husb (sudden cardiac death) in Sep 06, 2020 At times she gets worse with grief   Went to HR today and talked to them  They were concerned about emotions   Taking care of herself  Fair health habits  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  Will start back up in the fall/ looking forward to that   May do silver sneakers at 76 as well   Has a daughter in law and friend to vent to  Not too close by  [facility] socialize as often   Needs a little time off to get her plan together  Not interested in any medication yet    BP Readings from Last 3 Encounters:  04/22/22 112/70  06/22/21 122/80  01/09/21 112/68   Pulse Readings from Last 3 Encounters:  04/22/22 94  06/22/21 78  01/09/21 78   PHQ score 5 GAD score 4   Patient Active Problem List   Diagnosis Date Noted   Left knee pain 06/22/2021   BMI 37.0-37.9, adult 01/09/2021   Right hip pain 12/08/2020    Grief reaction 11/22/2019   Prediabetes 10/10/2014   S/P hysterectomy 06/27/2014   Stress reaction 05/04/2014   Routine general medical examination at a health care facility 02/15/2011   Routine gynecological examination 02/15/2011   Other screening mammogram 02/15/2011   ALLERGIC RHINITIS 04/23/2007   ASTHMA 04/23/2007   Past Medical History:  Diagnosis Date   Allergy    allergic rhinitis   Asthma    as a child   Ovarian cyst    Uterine fibroids affecting pregnancy    Past Surgical History:  Procedure Laterality Date   ABDOMINAL HYSTERECTOMY N/A 06/27/2014   Procedure: HYSTERECTOMY ABDOMINAL WITH BILATERAL SALPINGO OOPHORECTOMY;  Surgeon: Linda Hedges, DO;  Location: [facility];  Service: Gynecology;  Laterality: N/A;   Ramirez  01/2005   POPLITEAL SYNOVIAL CYST EXCISION     TUBAL LIGATION     Social History   Tobacco Use   Smoking status: Never   Smokeless tobacco: Never  Substance Use Topics   Alcohol use: Yes    Alcohol/week: 0.0 standard drinks of alcohol  Comment: occ wine   Drug use: No   Family History  Problem Relation Age of Onset   Heart disease Mother    Hypertension Mother    Allergies  Allergen Reactions   Shellfish Allergy Hives and Itching   Current Outpatient Medications on File Prior to Visit  Medication Sig Dispense Refill   Ascorbic Acid (VITAMIN C PO) Take 1 tablet by mouth daily.     BIOTIN FORTE PO Take 1 tablet by mouth daily.     cetirizine (ZYRTEC) 10 MG tablet Take 10 mg by mouth daily as needed.     Multiple Vitamin (MULTIVITAMIN) capsule Take 1 capsule by mouth daily.     VITAMIN D PO Take 1 capsule by mouth daily.     vitamin E 400 UNIT capsule Take 400 Units by mouth daily.     No current facility-administered medications on file prior to visit.    Review of Systems  Constitutional:  Negative for activity change, appetite change, fatigue, fever and unexpected weight change.  HENT:  Negative for congestion, ear pain, rhinorrhea,  sinus pressure and sore throat.   Eyes:  Negative for pain, redness and visual disturbance.  Respiratory:  Negative for cough, shortness of breath and wheezing.   Cardiovascular:  Negative for chest pain and palpitations.  Gastrointestinal:  Negative for abdominal pain, blood in stool, constipation and diarrhea.  Endocrine: Negative for polydipsia and polyuria.  Genitourinary:  Negative for dysuria, frequency and urgency.  Musculoskeletal:  Negative for arthralgias, back pain and myalgias.  Skin:  Negative for pallor and rash.  Allergic/Immunologic: Negative for environmental allergies.  Neurological:  Negative for dizziness, syncope and headaches.  Hematological:  Negative for adenopathy. Does not bruise/bleed easily.  Psychiatric/Behavioral:  Positive for dysphoric mood and sleep disturbance. Negative for decreased concentration, self-injury and suicidal ideas. The patient is nervous/anxious.        Objective:   Physical Exam Constitutional:      General: She is not in acute distress.    Appearance: Normal appearance. She is well-developed. She is obese. She is not ill-appearing.  HENT:     Head: Normocephalic and atraumatic.  Eyes:     Conjunctiva/sclera: Conjunctivae normal.     Pupils: Pupils are equal, round, and reactive to light.  Neck:     Thyroid: No thyromegaly.     Vascular: No carotid bruit or JVD.  Cardiovascular:     Rate and Rhythm: Normal rate and regular rhythm.     Heart sounds: Normal heart sounds.     No gallop.  Pulmonary:     Effort: Pulmonary effort is normal. No respiratory distress.     Breath sounds: Normal breath sounds. No wheezing or rales.  Abdominal:     General: There is distension. There is no abdominal bruit.     Palpations: Abdomen is soft.  Musculoskeletal:     Cervical back: Normal range of motion and neck supple.     Right lower leg: No edema.     Left lower leg: No edema.  Lymphadenopathy:     Cervical: No cervical adenopathy.   Skin:    General: Skin is warm and dry.     Coloration: Skin is not pale.     Findings: No rash.  Neurological:     Mental Status: She is alert.     Cranial Nerves: No cranial nerve deficit.     Coordination: Coordination normal.     Deep Tendon Reflexes: Reflexes are normal and symmetric. Reflexes  normal.     Comments: No tremor   Psychiatric:        Attention and Perception: Attention normal.        Mood and Affect: Mood is anxious and depressed.        Speech: Speech normal.        Behavior: Behavior normal.        Cognition and Memory: Cognition and memory normal.     Comments: Candidly discusses symptoms and stressors             Assessment & Plan:   Problem List Items Addressed This Visit       Other   Stress reaction - Primary    Primarily job related (grief from loss of husband is up and down as well)  Anxious symptoms  Reviewed stressors/ coping techniques/symptoms/ support sources/ tx options and side effects in detail today  Recommend good self care incl exercise and outdoor time  Enc her to continue to work on meditation  Plans to look into counseling with EAP program Time off- 5 days to get going with this  Declines medication -would prefer CBT to start  Handout given re: anxiety management  Reassuring exam

## 2022-04-22 NOTE — Patient Instructions (Addendum)
Look into the EAP program at your job  Get started with some counseling    If this does not work - let ue know and we can give you some resources   Take care of yourself  Exercise and meditation are helpful  Talk to folks you trust who support you

## 2022-04-22 NOTE — Assessment & Plan Note (Signed)
Primarily job related (grief from loss of husband is up and down as well)  Anxious symptoms  Reviewed stressors/ coping techniques/symptoms/ support sources/ tx options and side effects in detail today  Recommend good self care incl exercise and outdoor time  Enc her to continue to work on meditation  Plans to look into counseling with EAP program Time off- 5 days to get going with this  Declines medication -would prefer CBT to start  Handout given re: anxiety management  Reassuring exam

## 2022-04-30 ENCOUNTER — Telehealth: Payer: Self-pay | Admitting: Family Medicine

## 2022-04-30 DIAGNOSIS — Z0279 Encounter for issue of other medical certificate: Secondary | ICD-10-CM

## 2022-04-30 NOTE — Telephone Encounter (Signed)
This was placed in Dr Glori Bickers Box

## 2022-04-30 NOTE — Telephone Encounter (Signed)
Patient dropped off FMLA form to be filled out, I placed in Dr Alba Cory folder up front. She said it just needs to be faxed when done to number on form. She would also like a call when its done and also a copy. Call back is [phone]

## 2022-05-02 NOTE — Telephone Encounter (Signed)
Done and in IN box 

## 2022-05-02 NOTE — Telephone Encounter (Signed)
Called and spoke with patient informed pt that her paperwork was completed and she will need to sign the form. Left this up front for patient to p/u. Pt said that she will pick is up today before 5 pm.

## 2022-05-07 NOTE — Telephone Encounter (Signed)
Pt came back to office form faxed to employer and copy given to pt

## 2022-06-17 ENCOUNTER — Encounter: Payer: Self-pay | Admitting: Gastroenterology

## 2022-06-24 IMAGING — DX DG KNEE COMPLETE 4+V*L*
4 series · 4 of 4 positions shown · non-contrast
Comparison: None.

CLINICAL DATA: Left knee pain

EXAM:
LEFT KNEE - COMPLETE 4+ VIEW

[knee ap]
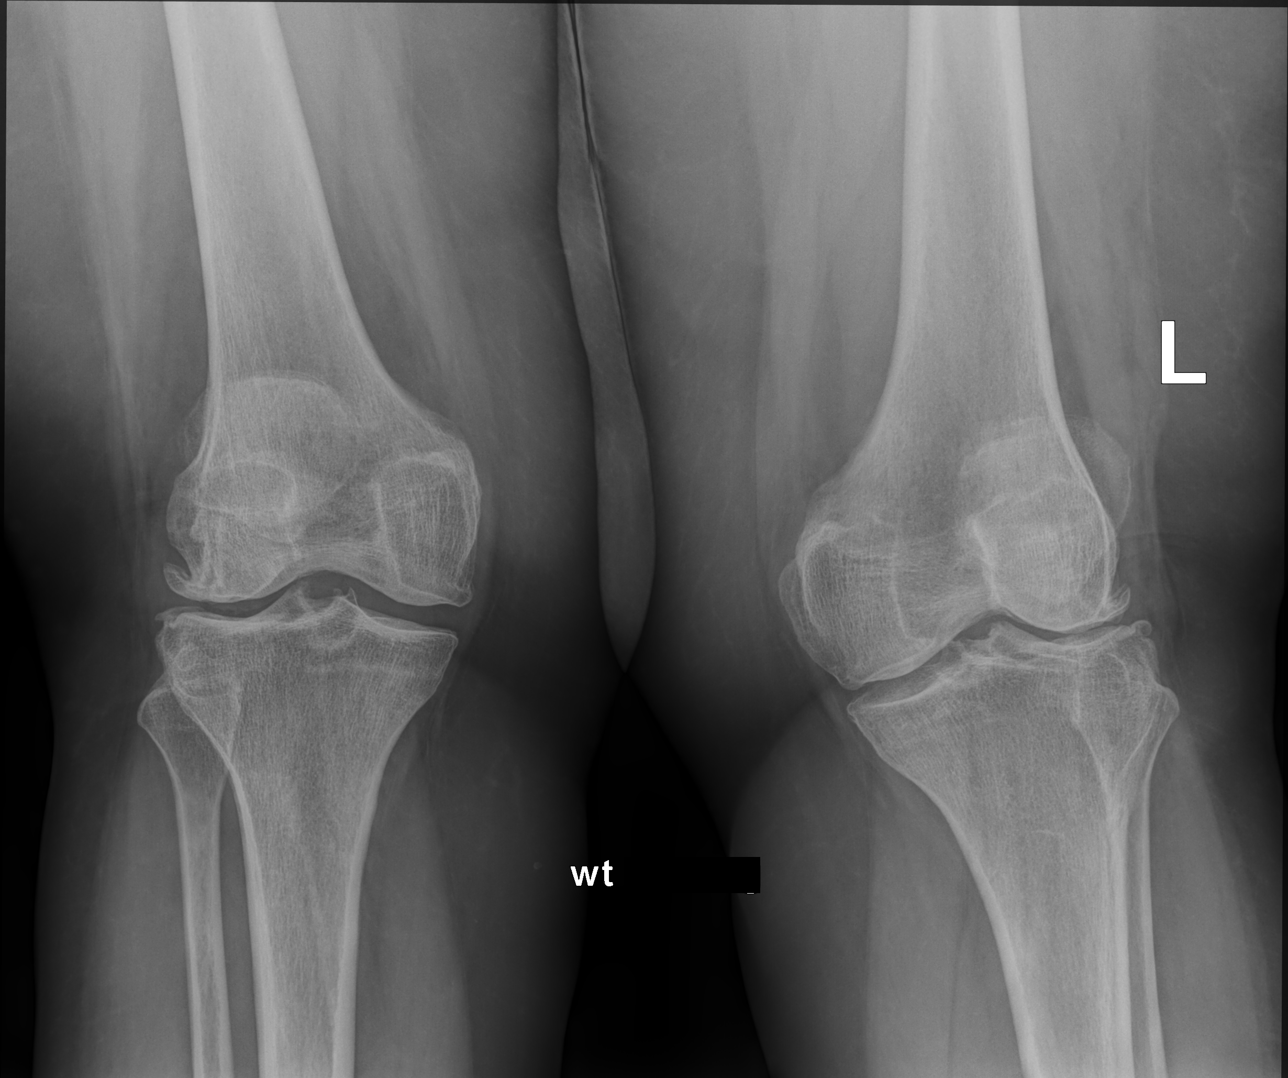

[knee [person_name] view pa]
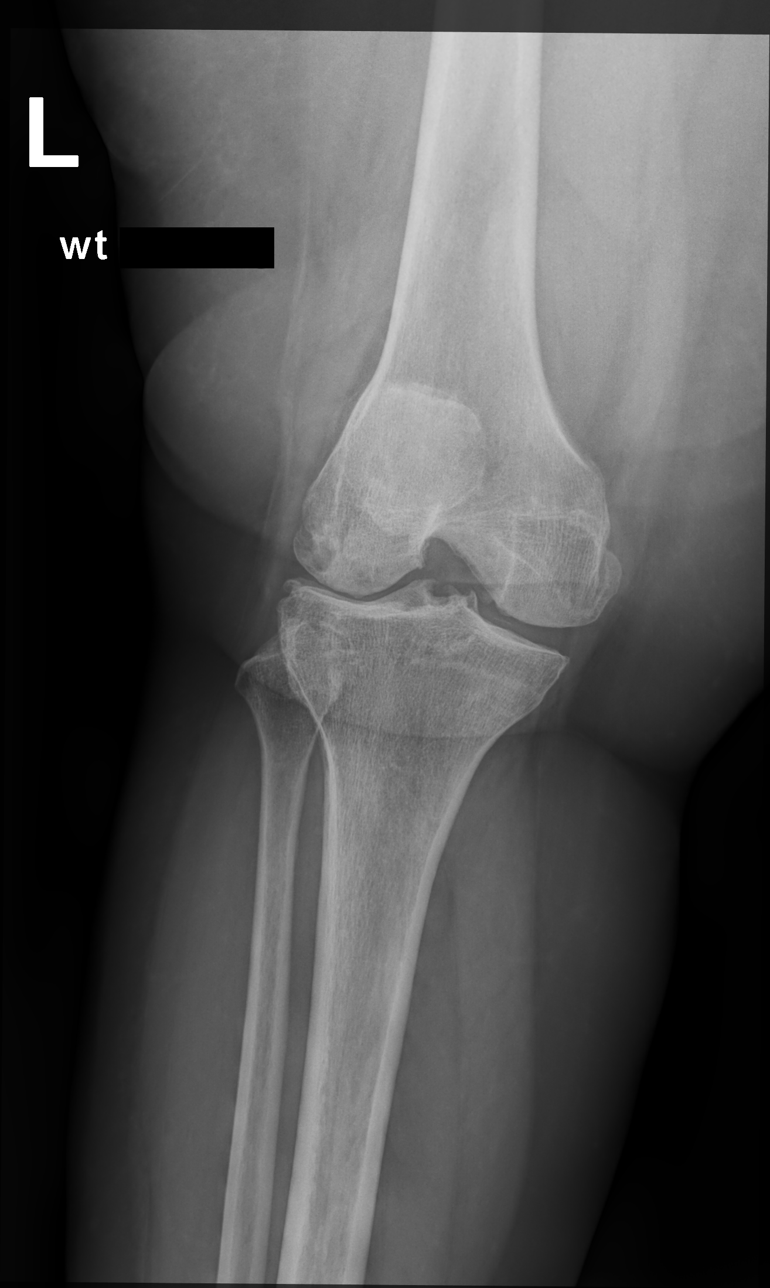

[knee lat]
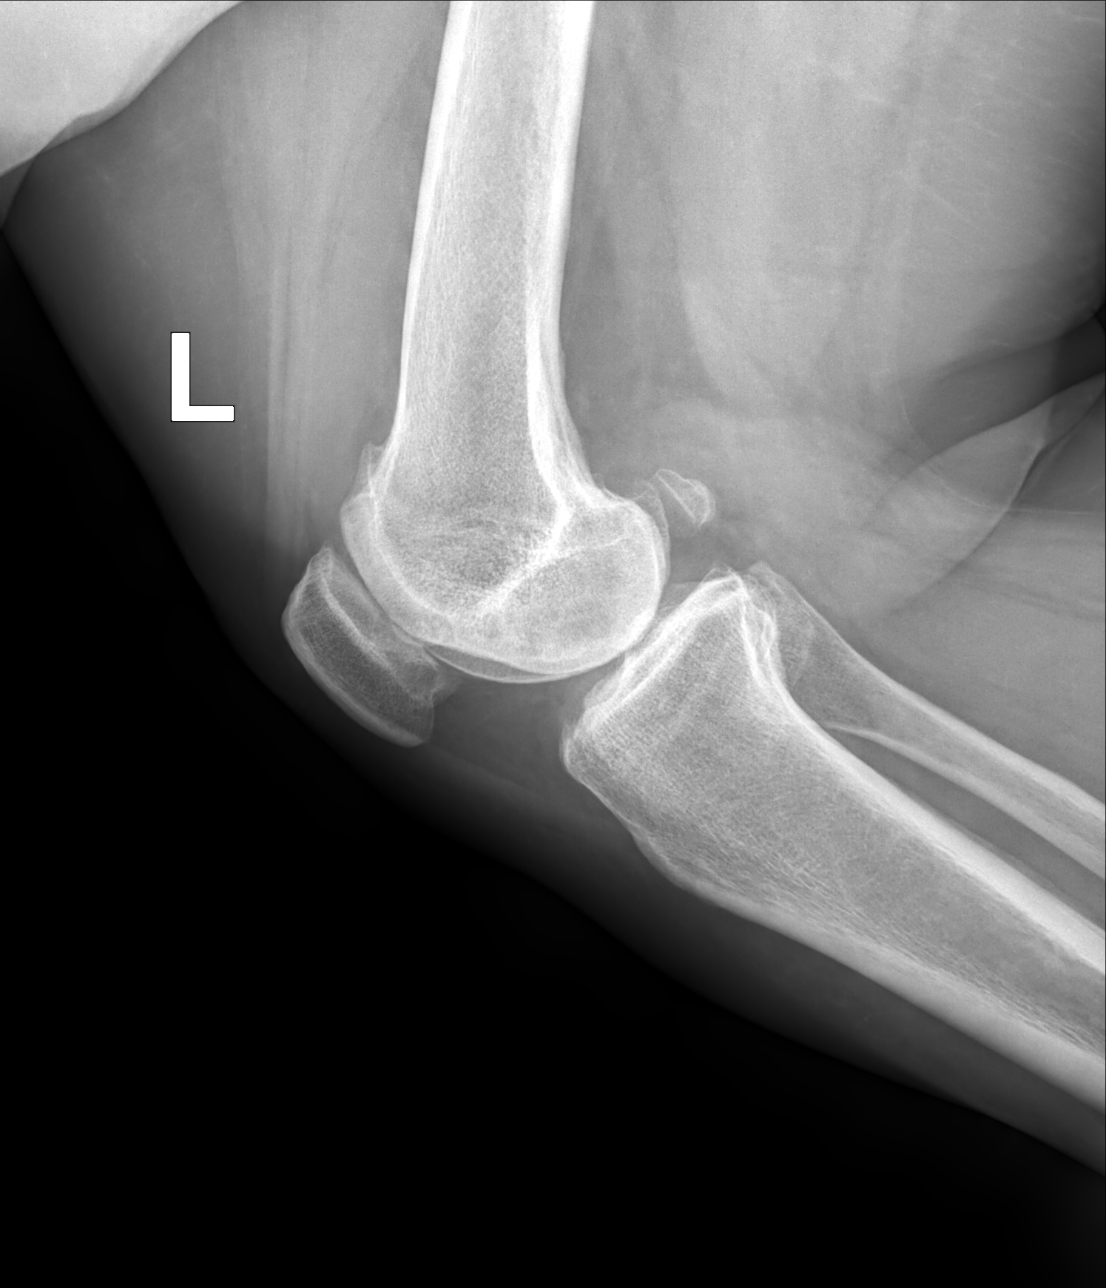

[patella (sunrise)]
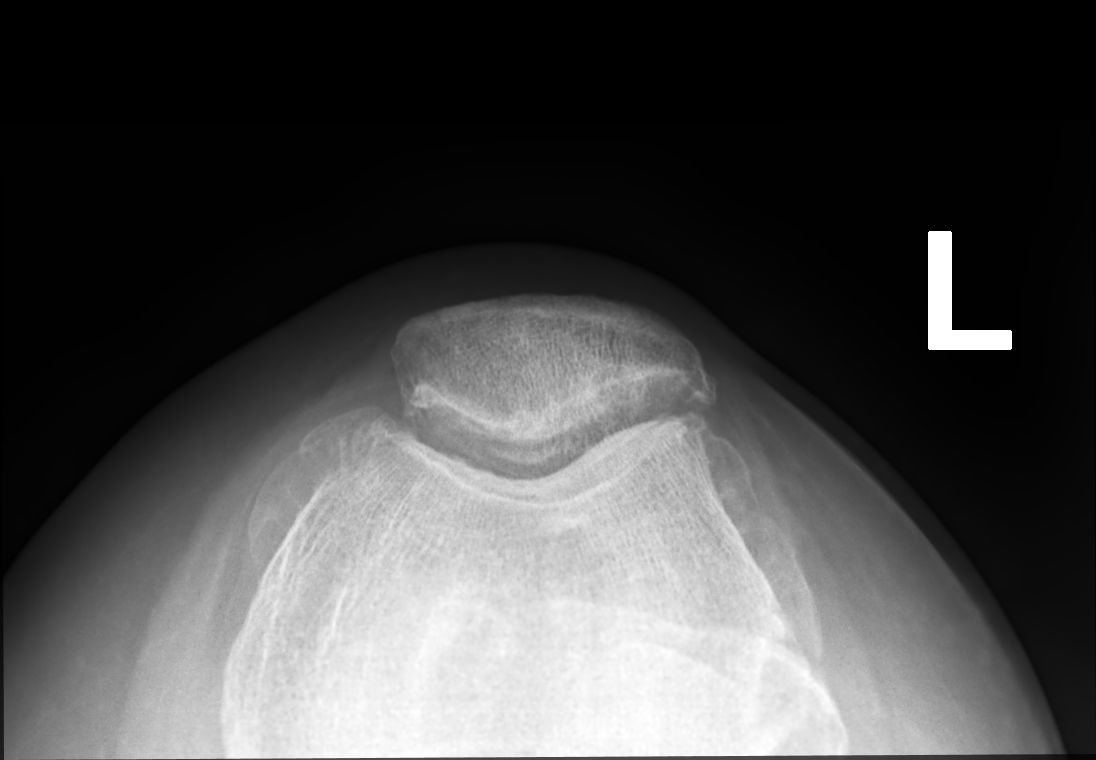

[4 of 4 positions shown; findings below may reference images not displayed]

FINDINGS: Advanced tricompartment degenerative changes, most pronounced in the
lateral and patellofemoral compartments with joint space narrowing
and spurring. Moderate joint effusion. No acute bony abnormality.
Specifically, no fracture, subluxation, or dislocation.
IMPRESSION: Advanced degenerative changes. Moderate joint effusion. No acute
bony abnormality.

## 2022-07-03 ENCOUNTER — Ambulatory Visit (AMBULATORY_SURGERY_CENTER): Payer: Self-pay | Admitting: *Deleted

## 2022-07-03 VITALS — Ht 61.0 in | Wt 197.6 lb

## 2022-07-03 DIAGNOSIS — Z1211 Encounter for screening for malignant neoplasm of colon: Secondary | ICD-10-CM

## 2022-07-03 MED ORDER — NA SULFATE-K SULFATE-MG SULF 17.5-3.13-1.6 GM/177ML PO SOLN: 1.0000 | Freq: Once | ORAL | 0 refills | Status: AC

## 2022-07-03 NOTE — Progress Notes (Signed)

## 2022-07-23 ENCOUNTER — Encounter: Payer: Self-pay | Admitting: Gastroenterology

## 2022-07-30 ENCOUNTER — Encounter: Payer: Self-pay | Admitting: Gastroenterology

## 2022-07-30 ENCOUNTER — Ambulatory Visit (AMBULATORY_SURGERY_CENTER): Payer: BC Managed Care – PPO | Admitting: Gastroenterology

## 2022-07-30 VITALS — BP 125/73 | HR 73 | Temp 97.1°F | Resp 14 | Ht 61.0 in | Wt 197.6 lb

## 2022-07-30 DIAGNOSIS — Z1211 Encounter for screening for malignant neoplasm of colon: Secondary | ICD-10-CM | POA: Diagnosis present

## 2022-07-30 MED ORDER — SODIUM CHLORIDE 0.9 % IV SOLN: 500.0000 mL | Freq: Once | INTRAVENOUS | Status: DC

## 2022-07-30 NOTE — Progress Notes (Signed)
History & Physical  Primary Care Physician:  Ramirez, Isabella Fanny, MD Primary Gastroenterologist: Isabella Edward, MD  CHIEF COMPLAINT:  CRC screening   HPI: Isabella Ramirez is a 66 y.o. female average risk CRC screening for colonoscopy.    Past Medical History:  Diagnosis Date   Allergy    allergic rhinitis   Arthritis    knees   Asthma    as a child   Ovarian cyst    Uterine fibroids affecting pregnancy     Past Surgical History:  Procedure Laterality Date   ABDOMINAL HYSTERECTOMY N/A 06/27/2014   Procedure: HYSTERECTOMY ABDOMINAL WITH BILATERAL SALPINGO OOPHORECTOMY;  Surgeon: Isabella Hedges, DO;  Location: [facility];  Service: Gynecology;  Laterality: N/A;   LEEP  01/2005   POPLITEAL SYNOVIAL CYST EXCISION     TUBAL LIGATION      Prior to Admission medications   Medication Sig Start Date End Date Taking? Authorizing Provider  APPLE CIDER VINEGAR PO Take by mouth daily. Take one daily   Yes [provider]  Ascorbic Acid (VITAMIN C PO) Take 1 tablet by mouth daily.   Yes [provider]  BIOTIN FORTE PO Take 1 tablet by mouth daily.   Yes [provider]  cetirizine (ZYRTEC) 10 MG tablet Take 10 mg by mouth daily as needed.   Yes [provider]  COLLAGEN PO Take by mouth daily. Take one daily   Yes [provider]  Multiple Vitamin (MULTIVITAMIN) capsule Take 1 capsule by mouth daily.   Yes [provider]  Multiple Vitamins-Minerals (ZINC PO) Take by mouth daily. Take one daily   Yes [provider]  OVER THE COUNTER MEDICATION daily. Tumeric take one daily   Yes [provider]  VITAMIN D PO Take 1 capsule by mouth daily.   Yes [provider]  Ferrous Sulfate (IRON PO) Take by mouth daily. Take one daily    [provider]    Current Outpatient Medications  Medication Sig Dispense Refill   APPLE CIDER VINEGAR PO Take by mouth daily. Take one daily     Ascorbic Acid (VITAMIN C PO)  Take 1 tablet by mouth daily.     BIOTIN FORTE PO Take 1 tablet by mouth daily.     cetirizine (ZYRTEC) 10 MG tablet Take 10 mg by mouth daily as needed.     COLLAGEN PO Take by mouth daily. Take one daily     Multiple Vitamin (MULTIVITAMIN) capsule Take 1 capsule by mouth daily.     Multiple Vitamins-Minerals (ZINC PO) Take by mouth daily. Take one daily     OVER THE COUNTER MEDICATION daily. Tumeric take one daily     VITAMIN D PO Take 1 capsule by mouth daily.     Ferrous Sulfate (IRON PO) Take by mouth daily. Take one daily     Current Facility-Administered Medications  Medication Dose Route Frequency Provider Last Rate Last Admin   0.9 %  sodium chloride infusion  500 mL Intravenous Once Isabella Artist, MD        Allergies as of 07/30/2022 - Review Complete 07/30/2022  Allergen Reaction Noted   Shellfish allergy Hives and Itching 10/05/2013    Family History  Problem Relation Age of Onset   Heart disease Mother    Hypertension Mother    Colon polyps Neg Hx    Colon cancer Neg Hx    Crohn's disease Neg Hx    Esophageal cancer Neg Hx  Rectal cancer Neg Hx    Stomach cancer Neg Hx    Ulcerative colitis Neg Hx     Social History   Socioeconomic History   Marital status: Married    Spouse name: Not on file   Number of children: Not on file   Years of education: Not on file   Highest education level: Not on file  Occupational History   Not on file  Tobacco Use   Smoking status: Never    Passive exposure: Never   Smokeless tobacco: Never  Vaping Use   Vaping Use: Never used  Substance and Sexual Activity   Alcohol use: Yes    Alcohol/week: 0.0 standard drinks of alcohol    Comment: occ wine   Drug use: No   Sexual activity: Not on file  Other Topics Concern   Not on file  Social History Narrative   Not on file   Social Determinants of Health   Financial Resource Strain: Not on file  Food Insecurity: Not on file  Transportation Needs: Not on file   Physical Activity: Not on file  Stress: Not on file  Social Connections: Not on file  Intimate Partner Violence: Not on file    Review of Systems:  All systems reviewed were negative except where noted in HPI.   Physical Exam: General:  Alert, well-developed, in NAD Head:  Normocephalic and atraumatic. Eyes:  Sclera clear, no icterus.   Conjunctiva pink. Ears:  Normal auditory acuity. Mouth:  No deformity or lesions.  Neck:  Supple; no masses . Lungs:  Clear throughout to auscultation.   No wheezes, crackles, or rhonchi. No acute distress. Heart:  Regular rate and rhythm; no murmurs. Abdomen:  Soft, nondistended, nontender. No masses, hepatomegaly. No obvious masses.  Normal bowel .    Rectal:  Deferred   Msk:  Symmetrical without gross deformities.. Pulses:  Normal pulses noted. Extremities:  Without edema. Neurologic:  Alert and  oriented x4;  grossly normal neurologically. Skin:  Intact without significant lesions or rashes. Cervical Nodes:  No significant cervical adenopathy. Psych:  Alert and cooperative. Normal mood and affect.   Impression / Ramirez:   Average risk CRC screening for colonoscopy.  Isabella Ramirez. Isabella Ramirez  07/30/2022, 2:17 PM See Isabella Ramirez, Isabella Ramirez [facility], to contact our on call provider

## 2022-07-30 NOTE — Progress Notes (Signed)
Pt's states no medical or surgical changes since previsit or office visit. 

## 2022-07-30 NOTE — Patient Instructions (Signed)
YOU HAD AN ENDOSCOPIC PROCEDURE TODAY AT [facility]:   Refer to the procedure report that was given to you for any specific questions about what was found during the examination.  If the procedure report does not answer your questions, please call your gastroenterologist to clarify.  If you requested that your care partner not be given the details of your procedure findings, then the procedure report has been included in a sealed envelope for you to review at your convenience later.  YOU SHOULD EXPECT: Some feelings of bloating in the abdomen. Passage of more gas than usual.  Walking can help get rid of the air that was put into your GI tract during the procedure and reduce the bloating. If you had a lower endoscopy (such as a colonoscopy or flexible sigmoidoscopy) you may notice spotting of blood in your stool or on the toilet paper. If you underwent a bowel prep for your procedure, you may not have a normal bowel movement for a few days.  Please Note:  You might notice some irritation and congestion in your nose or some drainage.  This is from the oxygen used during your procedure.  There is no need for concern and it should clear up in a day or so.  SYMPTOMS TO REPORT IMMEDIATELY:  Following lower endoscopy (colonoscopy or flexible sigmoidoscopy):  Excessive amounts of blood in the stool  Significant tenderness or worsening of abdominal pains  Swelling of the abdomen that is new, acute  Fever of 100F or higher   For urgent or emergent issues, a gastroenterologist can be reached at any hour by calling [phone]. Do not use MyChart messaging for urgent concerns.    DIET:  We do recommend a small meal at first, but then you may proceed to your regular diet.  Drink plenty of fluids but you should avoid alcoholic beverages for 24 hours.  MEDICATIONS: Continue present medications.  FOLLOW UP: Repeat colonoscopy in 10 years for screening purposes.  Thank you for allowing  [place] to provide for your healthcare needs today.  ACTIVITY:  You should plan to take it easy for the rest of today and you should NOT DRIVE or use heavy machinery until tomorrow (because of the sedation medicines used during the test).    FOLLOW UP: Our staff will call the number listed on your records the next business day following your procedure.  We will call around 7:15- 8:00 am to check on you and address any questions or concerns that you may have regarding the information given to you following your procedure. If we do not reach you, we will leave a message.     If any biopsies were taken you will be contacted by phone or by letter within the next 1-3 weeks.  Please call us at [phone] if you have not heard about the biopsies in 3 weeks.    SIGNATURES/CONFIDENTIALITY: You and/or your care partner have signed paperwork which will be entered into your electronic medical record.  These signatures attest to the fact that that the information above on your After Visit Summary has been reviewed and is understood.  Full responsibility of the confidentiality of this discharge information lies with you and/or your care-partner.

## 2022-07-30 NOTE — Progress Notes (Signed)
Report to PACU, RN, vss, BBS= Clear.  

## 2022-07-30 NOTE — Op Note (Signed)
[name] Procedure Date: 07/30/2022 2:13 PM MRN: [id number] Endoscopist: Ladene Artist , MD, [id number] Age: 66 Referring MD:  Date of Birth: Feb 27, 1956 Gender: Female Account #: [id number] Procedure:                Colonoscopy Indications:              Screening for colorectal malignant neoplasm Medicines:                Monitored Anesthesia Care Procedure:                Pre-Anesthesia Assessment:                           - Prior to the procedure, a History and Physical                            was performed, and patient medications and                            allergies were reviewed. The patient's tolerance of                            previous anesthesia was also reviewed. The risks                            and benefits of the procedure and the sedation                            options and risks were discussed with the patient.                            All questions were answered, and informed consent                            was obtained. Prior Anticoagulants: The patient has                            taken no anticoagulant or antiplatelet agents. ASA                            Grade Assessment: II - A patient with mild systemic                            disease. After reviewing the risks and benefits,                            the patient was deemed in satisfactory condition to                            undergo the procedure.                           After obtaining informed consent, the colonoscope  was passed under direct vision. Throughout the                            procedure, the patient's blood pressure, pulse, and                            oxygen saturations were monitored continuously. The                            CF [id number] #[id number] was introduced through the anus                            and advanced to the the cecum, identified by                            appendiceal  orifice and ileocecal valve. The                            ileocecal valve, appendiceal orifice, and rectum                            were photographed. The quality of the bowel                            preparation was good after substantial lavage,                            suction. The colonoscopy was performed without                            difficulty. The patient tolerated the procedure                            well. Scope In: 2:24:14 PM Scope Out: 2:41:06 PM Scope Withdrawal Time: 0 hours 10 minutes 43 seconds  Total Procedure Duration: 0 hours 16 minutes 52 seconds  Findings:                 The perianal and digital rectal examinations were                            normal.                           The entire examined colon appeared normal on direct                            and retroflexion views. Complications:            No immediate complications. Estimated blood loss:                            None. Estimated Blood Loss:     Estimated blood loss: none. Impression:               - The entire examined colon is normal on direct and  retroflexion views.                           - No specimens collected. Recommendation:           - Repeat colonoscopy in 10 years for screening                            purposes.                           - Patient has a contact number available for                            emergencies. The signs and symptoms of potential                            delayed complications were discussed with the                            patient. Return to normal activities tomorrow.                            Written discharge instructions were provided to the                            patient.                           - Resume previous diet.                           - Continue present medications. Ladene Artist, MD 07/30/2022 2:44:48 PM This report has been signed electronically.

## 2022-07-31 ENCOUNTER — Telehealth: Payer: Self-pay

## 2022-07-31 NOTE — Telephone Encounter (Signed)
  Follow up Call-     07/30/2022    1:07 PM  Call back number  Post procedure Call Back phone  # [phone]  Permission to leave phone message Yes     Patient questions:  Do you have a fever, pain , or abdominal swelling? No. Pain Score  0 *  Have you tolerated food without any problems? Yes.    Have you been able to return to your normal activities? Yes.    Do you have any questions about your discharge instructions: Diet   No. Medications  No. Follow up visit  No.  Do you have questions or concerns about your Care? No.  Actions: * If pain score is 4 or above: No action needed, pain <4.

## 2022-08-05 ENCOUNTER — Encounter: Payer: BC Managed Care – PPO | Admitting: Gastroenterology

## 2022-08-13 ENCOUNTER — Other Ambulatory Visit: Payer: Self-pay | Admitting: Family Medicine

## 2022-08-13 DIAGNOSIS — Z1231 Encounter for screening mammogram for malignant neoplasm of breast: Secondary | ICD-10-CM

## 2022-10-08 ENCOUNTER — Ambulatory Visit
Admission: RE | Admit: 2022-10-08 | Discharge: 2022-10-08 | Disposition: A | Discharge status: Home / Self Care | Payer: BC Managed Care – PPO | Source: Ambulatory Visit | Attending: Family Medicine | Admitting: Family Medicine

## 2022-10-08 DIAGNOSIS — Z1231 Encounter for screening mammogram for malignant neoplasm of breast: Secondary | ICD-10-CM

## 2022-10-27 ENCOUNTER — Ambulatory Visit
Admission: EM | Admit: 2022-10-27 | Discharge: 2022-10-27 | Disposition: A | Discharge status: Home / Self Care | Payer: BC Managed Care – PPO | Attending: Urgent Care | Admitting: Urgent Care

## 2022-10-27 DIAGNOSIS — T24291A Burn of second degree of multiple sites of right lower limb, except ankle and foot, initial encounter: Secondary | ICD-10-CM

## 2022-10-27 MED ORDER — BACITRACIN ZINC 500 UNIT/GM EX OINT: 1.0000 | TOPICAL_OINTMENT | Freq: Every day | CUTANEOUS | 0 refills | Status: AC

## 2022-10-27 NOTE — Discharge Instructions (Addendum)
Change dressing at least once a day or gauze when saturated.  Apply additional bacitracin ointment to Xeroform (yellow) gauze.  Reapply sterile gauze to absorb drainage.
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
## 2022-10-28 ENCOUNTER — Telehealth: Payer: Self-pay
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
## 2022-10-28 NOTE — Telephone Encounter (Signed)
Per chart review tab pt was seen at [facility] on 10/27/22. Sending note to Dr Glori Bickers and [employer].

## 2022-11-05 ENCOUNTER — Encounter: Payer: Self-pay | Admitting: Family Medicine

## 2022-11-05 ENCOUNTER — Ambulatory Visit: Payer: BC Managed Care – PPO | Admitting: Family Medicine

## 2022-11-05 VITALS — BP 126/76 | HR 92 | Temp 97.2°F | Ht 61.0 in | Wt 202.1 lb

## 2022-11-05 DIAGNOSIS — T24211A Burn of second degree of right thigh, initial encounter: Secondary | ICD-10-CM | POA: Diagnosis not present

## 2022-11-05 MED ORDER — SILVER SULFADIAZINE 1 % EX CREA: 1.0000 | TOPICAL_CREAM | Freq: Every day | CUTANEOUS | 0 refills | Status: DC
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
## 2022-11-05 NOTE — Patient Instructions (Addendum)
Use silvadene cream to open areas over the next 5 days then transition to bacitracin cream.  Work from home for the next week to limit movement and optimize healing. Letter provided today.  Return with Dr Glori Bickers or myself next Monday for check up. Let us know sooner if fever, streaking redness, draining pus or any worsening symptoms.

## 2022-11-07 DIAGNOSIS — T24211A Burn of second degree of right thigh, initial encounter: Secondary | ICD-10-CM | POA: Insufficient documentation

## 2022-11-07 NOTE — Assessment & Plan Note (Addendum)
Discussed wound care at home. She felt she had been doing well working from home but yesterday when she returned to work and was more active, noted worsened pain, and difficulty keeping bandage on leg. Recommend work from home for another week - letter provided.  Rec silvadene cream use for the next 1/2 week to open denuded areas of skin then transition to bacitracin ointment (she has at home.  Rec f/u with myself or PCP on Monday.  Red flags to seek sooner care reviewed.

## 2022-11-11 ENCOUNTER — Encounter: Payer: Self-pay | Admitting: Family Medicine

## 2022-11-11 ENCOUNTER — Ambulatory Visit: Payer: BC Managed Care – PPO | Admitting: Family Medicine

## 2022-11-11 VITALS — BP 118/78 | HR 85 | Temp 97.8°F | Ht 61.0 in | Wt 200.2 lb

## 2022-11-11 DIAGNOSIS — T24211A Burn of second degree of right thigh, initial encounter: Secondary | ICD-10-CM
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
## 2022-11-11 NOTE — Assessment & Plan Note (Signed)
Improved- wet areas are drying and remain pink Will continue silvadene cream on small central wet area  (this appears white due to cream on picture) Continue soap/water and loose cover prn Bacitracin on other areas  Loose clothing/ prevent trauma Update if not starting to improve in a week or if worsening

## 2022-11-13 ENCOUNTER — Telehealth: Payer: Self-pay

## 2022-11-13 NOTE — Telephone Encounter (Signed)
LVM for patient to call back and reschedule.

## 2022-11-13 NOTE — Telephone Encounter (Signed)
[facility] - Client Nonclinical Telephone Record  [facility] Client [facility] - Client Client Site [place] - Day Provider AA - PHYSICIAN, NOT LISTED- MD Contact Type Call Who Is Calling Patient / Member / Family / Caregiver Caller Name Leha Hatz Caller Phone Number [phone] Patient Name Isabella Ramirez Patient DOB 05-12-2056 Call Type Message Only Information Provided Reason for Call Request to Reschedule Office Appointment Initial Comment Caller states she needs to reschedule her appt. Disp. Time Disposition Final User 11/13/2022 8:10:27 AM General Information Provided Yes Green, Amy Call Closed By: Philis Kendall Transaction Date/Time: 11/13/2022 8:08:20 AM (ET

## 2022-11-14 ENCOUNTER — Other Ambulatory Visit (INDEPENDENT_AMBULATORY_CARE_PROVIDER_SITE_OTHER): Payer: BC Managed Care – PPO

## 2022-11-14 ENCOUNTER — Telehealth (INDEPENDENT_AMBULATORY_CARE_PROVIDER_SITE_OTHER): Payer: BC Managed Care – PPO | Admitting: Family Medicine

## 2022-11-14 DIAGNOSIS — R7303 Prediabetes: Secondary | ICD-10-CM

## 2022-11-14 DIAGNOSIS — Z0000 Encounter for general adult medical examination without abnormal findings: Secondary | ICD-10-CM

## 2022-11-14 LAB — CBC WITH DIFFERENTIAL/PLATELET
Basophils Absolute: 0 10*3/uL (ref 0.0–0.1)
Basophils Relative: 1.3 % (ref 0.0–3.0)
Eosinophils Absolute: 0.1 10*3/uL (ref 0.0–0.7)
Eosinophils Relative: 4.4 % (ref 0.0–5.0)
HCT: 40.8 % (ref 36.0–46.0)
Hemoglobin: 13.6 g/dL (ref 12.0–15.0)
Lymphocytes Relative: 30.5 % (ref 12.0–46.0)
Lymphs Abs: 0.9 10*3/uL (ref 0.7–4.0)
MCHC: 33.2 g/dL (ref 30.0–36.0)
MCV: 90.7 fl (ref 78.0–100.0)
Monocytes Absolute: 0.3 10*3/uL (ref 0.1–1.0)
Monocytes Relative: 11.1 % (ref 3.0–12.0)
Neutro Abs: 1.6 10*3/uL (ref 1.4–7.7)
Neutrophils Relative %: 52.7 % (ref 43.0–77.0)
Platelets: 325 10*3/uL (ref 150.0–400.0)
RBC: 4.5 Mil/uL (ref 3.87–5.11)
RDW: 12.4 % (ref 11.5–15.5)
WBC: 3.1 10*3/uL — ABNORMAL LOW (ref 4.0–10.5)

## 2022-11-14 LAB — COMPREHENSIVE METABOLIC PANEL
ALT: 19 U/L (ref 0–35)
AST: 20 U/L (ref 0–37)
Albumin: 3.7 g/dL (ref 3.5–5.2)
Alkaline Phosphatase: 67 U/L (ref 39–117)
BUN: 13 mg/dL (ref 6–23)
CO2: 32 mEq/L (ref 19–32)
Calcium: 9.3 mg/dL (ref 8.4–10.5)
Chloride: 103 mEq/L (ref 96–112)
Creatinine, Ser: 0.71 mg/dL (ref 0.40–1.20)
GFR: 89.38 mL/min (ref >60.00)
Glucose, Bld: 93 mg/dL (ref 70–99)
Potassium: 4.1 mEq/L (ref 3.5–5.1)
Sodium: 143 mEq/L (ref 135–145)
Total Bilirubin: 0.4 mg/dL (ref 0.2–1.2)
Total Protein: 6.3 g/dL (ref 6.0–8.3)

## 2022-11-14 LAB — LIPID PANEL
Cholesterol: 186 mg/dL (ref 0–200)
HDL: 69.1 mg/dL (ref >39.00)
LDL Cholesterol: 105 mg/dL — ABNORMAL HIGH (ref 0–99)
NonHDL: 117.19
Total CHOL/HDL Ratio: 3
Triglycerides: 60 mg/dL (ref 0.0–149.0)
VLDL: 12 mg/dL (ref 0.0–40.0)

## 2022-11-14 LAB — HEMOGLOBIN A1C: Hgb A1c MFr Bld: 5.9 % (ref 4.6–6.5)

## 2022-11-14 LAB — TSH: TSH: 2.22 u[IU]/mL (ref 0.35–5.50)

## 2022-11-14 NOTE — Telephone Encounter (Signed)
-----   Message from Ellamae Sia sent at 11/14/2022  7:15 AM EST ----- Regarding: Lab orders for today Patient is scheduled for CPX labs, please order future labs, Thanks , Karna Christmas

## 2022-11-22 ENCOUNTER — Encounter: Payer: BC Managed Care – PPO | Admitting: Family Medicine

## 2022-11-26 ENCOUNTER — Encounter: Payer: BC Managed Care – PPO | Admitting: Family Medicine

## 2022-11-27 ENCOUNTER — Ambulatory Visit (INDEPENDENT_AMBULATORY_CARE_PROVIDER_SITE_OTHER): Payer: BC Managed Care – PPO | Admitting: Family Medicine

## 2022-11-27 ENCOUNTER — Encounter: Payer: Self-pay | Admitting: Family Medicine

## 2022-11-27 VITALS — BP 114/70 | HR 83 | Temp 97.6°F | Ht 60.0 in | Wt 202.4 lb

## 2022-11-27 DIAGNOSIS — X12XXXA Contact with other hot fluids, initial encounter: Secondary | ICD-10-CM

## 2022-11-27 DIAGNOSIS — Z0000 Encounter for general adult medical examination without abnormal findings: Secondary | ICD-10-CM

## 2022-11-27 DIAGNOSIS — R7303 Prediabetes: Secondary | ICD-10-CM

## 2022-11-27 DIAGNOSIS — Z6839 Body mass index (BMI) 39.0-39.9, adult: Secondary | ICD-10-CM

## 2022-11-27 DIAGNOSIS — Z23 Encounter for immunization: Secondary | ICD-10-CM | POA: Diagnosis not present

## 2022-11-27 DIAGNOSIS — T24211A Burn of second degree of right thigh, initial encounter: Secondary | ICD-10-CM | POA: Diagnosis not present

## 2022-11-27 DIAGNOSIS — E2839 Other primary ovarian failure: Secondary | ICD-10-CM

## 2022-11-27 DIAGNOSIS — E6609 Other obesity due to excess calories: Secondary | ICD-10-CM

## 2022-11-27 NOTE — Assessment & Plan Note (Signed)
Screening dexa ordered   No falls or fx Disc ca and D for bone health along with exercise
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
## 2022-11-27 NOTE — Patient Instructions (Addendum)
If you are interested in the new shingles vaccine (Shingrix) - call your local pharmacy to check on coverage and availability  If affordable, get on a wait list at your pharmacy to get the vaccine.  Prevnar 20 vaccine (pneumonia vaccine)   For bones Try to get 1200-1500 mg of calcium per day with at least 2000 iu of vitamin D - for bone health  For exercise  Get back into routine with some cardio  Add some strength training to your routine, this is important for bone and brain health and can reduce your risk of falls and help your body use insulin properly and regulate weight  Light weights, exercise bands , and internet videos are a good way to start  Yoga (chair or regular), machines , floor exercises or a gym with machines are also good options  Your class sounds great!!     Call the breast center to schedule your bone density test  Please call the location of your choice from the menu below to schedule your Mammogram and/or Bone Density appointment.    [facility]                      Phone:  [phone], [place] [address]                                                             Services: Traditional and 3D Mammogram, [place] [facility] Density                 Phone: [phone] [address], [place] [address]    Service: Bone Density ONLY   *this site does NOT perform mammograms  [facility]                        Phone:  [phone] [address], [place] [address]                                            Services:  3D Mammogram and [place] at [facility]   Phone:  [phone]   [address]  [place], [place] [address]                                            Services: 3D Mammogram and [place]  [place] at [facility])  Phone:  [phone]   [address] [place], [place] [address]                                              Services:  3D Mammogram and Bone Density

## 2022-11-27 NOTE — Assessment & Plan Note (Signed)
Discussed how this problem influences overall health and the risks it imposes  Reviewed plan for weight loss with lower calorie diet (via better food choices and also portion control or program like weight watchers) and exercise building up to or more than 30 minutes 5 days per week including some aerobic activity    

## 2022-11-27 NOTE — Assessment & Plan Note (Signed)
Continues to heal and improve

## 2022-11-27 NOTE — Assessment & Plan Note (Signed)
Lab Results  Component Value Date   HGBA1C 5.9 11/14/2022   disc imp of low glycemic diet and wt loss to prevent DM2

## 2022-11-27 NOTE — Assessment & Plan Note (Signed)
Reviewed health habits including diet and exercise and skin cancer prevention Reviewed appropriate screening tests for age  Also reviewed health mt list, fam hx and immunization status , as well as social and family history   See HPI Labs reviewed  Interested in shingrix-will check on coverage Prevnar 20 given today  Dexa ordered-pt will call to schedule Colonoscopy utd 07/2022  PHQ is 0  Diet/health habits are improving  Discussed rec for wt bearing exercise

## 2023-11-07 ENCOUNTER — Ambulatory Visit: Payer: Self-pay | Admitting: Family Medicine

## 2023-11-07 NOTE — Telephone Encounter (Signed)
 Chief Complaint: chest tightness, SOB Symptoms: chest tightness, SOB Frequency: 1 wk Pertinent Negatives: Patient denies dizziness, weakness, N/V, heart palpitations, fever Disposition: [x] ED /[] Urgent Care (no appt availability in office) / [] Appointment(In office/virtual)/ []  [facility]/ [] Home Care/ [x] Refused Recommended Disposition /[] [facility]/ []  Follow-up with PCP  Additional Notes: Pt reports 5-6/10 constant chest tightness with SOB for 1 wk. Pt states she feels particularly SOB after walking up a flight of stairs. Pt states she has not been to work in 2 days because she does not feel right. Pt states the chest tightness is in the breast area and radiates towards the neck. Pt endorses chest tightness while on the call with the RN. Denies heart palpitations, N/V, dizziness. States she woke up feeling warm but that may have been hot flashes. Per protocol for CP, RN advised pt go to the ED. RN educated pt on why the ED is the best and safest place to have her symptoms addressed. RN offered to call 911 for the pt and educated the pt on why EMS is the safest mode of transport. Pt declined the ED. Pt stated she will have her daughter in law bring her to Urgent Care if she feels it is necessary. RN advised pt to call 911 if she worsens, to which she verbalized understanding. RN also called the CAL to inform.   Copied from CRM [id number]. Topic: Clinical - Red Word Triage >> Nov 07, 2023 12:22 PM Alvino Blood C wrote: Red Word that prompted transfer to Nurse Triage: Patient states she has been feeling a tightness in her chest and when she is carrying things loses her breath Reason for Disposition  [1] Chest pain lasts > 5 minutes AND [2] age > 47  Answer Assessment - Initial Assessment Questions 1. LOCATION: "Where does it hurt?"       "Up in the breast area towards the neck" 2. RADIATION: "Does the pain go anywhere else?" (e.g., into neck, jaw, arms, back)     "To the neck a  little bit but not a lot", "a little bit to the back but it's real mild" 3. ONSET: "When did the chest pain begin?" (Minutes, hours or days)      A week ago 4. PATTERN: "Does the pain come and go, or has it been constant since it started?"  "Does it get worse with exertion?"      Constant, even feels it when she is just sitting, "I want to take an extra breath" 5. DURATION: "How long does it last" (e.g., seconds, minutes, hours)     Constant 6. SEVERITY: "How bad is the pain?"  (e.g., Scale 1-10; mild, moderate, or severe)    - MILD (1-3): doesn't interfere with normal activities     - MODERATE (4-7): interferes with normal activities or awakens from sleep    - SEVERE (8-10): excruciating pain, unable to do any normal activities       5-6/10 7. CARDIAC RISK FACTORS: "Do you have any history of heart problems or risk factors for heart disease?" (e.g., angina, prior heart attack; diabetes, high blood pressure, high cholesterol, smoker, or strong family history of heart disease)     Prediabetes  8. PULMONARY RISK FACTORS: "Do you have any history of lung disease?"  (e.g., blood clots in lung, asthma, emphysema, birth control pills)     Asthma 9. CAUSE: "What do you think is causing the chest pain?"     Not sure  10. OTHER SYMPTOMS: "  Do you have any other symptoms?" (e.g., dizziness, nausea, vomiting, sweating, fever, difficulty breathing, cough)       Winded at the top of the steps, "short-winded" starting a 1 week ago. Pt states she has not been to work in two days because she doesn't feel right. Feeling an "uneasiness period, like the tightness", "like I want to cough but I can't." No cough or congestion. No dizziness or weakness. No nausea/vomiting. No heart palpitations. "I did wake up feeling very warm, hot flashes." No fever.  Protocols used: Chest Pain-A-AH

## 2023-11-07 NOTE — Telephone Encounter (Signed)
 Agree with ER determination  If she continues to refuse I will watch for UC corresp  Thanks

## 2024-01-15 ENCOUNTER — Other Ambulatory Visit: Payer: Self-pay | Admitting: Family Medicine

## 2024-01-15 ENCOUNTER — Encounter: Payer: Self-pay | Admitting: Family Medicine

## 2024-01-15 DIAGNOSIS — Z1231 Encounter for screening mammogram for malignant neoplasm of breast: Secondary | ICD-10-CM

## 2024-01-16 ENCOUNTER — Telehealth: Payer: Self-pay

## 2024-01-16 DIAGNOSIS — Z1231 Encounter for screening mammogram for malignant neoplasm of breast: Secondary | ICD-10-CM

## 2024-01-16 DIAGNOSIS — E2839 Other primary ovarian failure: Secondary | ICD-10-CM

## 2024-01-16 NOTE — Telephone Encounter (Signed)
 Does she prefer [facility] or [facility] for the bone density test?

## 2024-01-16 NOTE — Telephone Encounter (Signed)
 Copied from CRM [id number]. Topic: Clinical - Request for Lab/Test Order >> Jan 15, 2024  4:14 PM Isabella Ramirez wrote: Reason for CRM: Patient would like order put in for bone density and mammogram since the previous orders expired - she also wants to know if there's another location she can have bone density done other then the [facility] center on [address]? Please call her at [phone]

## 2024-01-19 DIAGNOSIS — Z1231 Encounter for screening mammogram for malignant neoplasm of breast: Secondary | ICD-10-CM | POA: Insufficient documentation

## 2024-01-19 NOTE — Telephone Encounter (Signed)
The orders are in.

## 2024-01-19 NOTE — Addendum Note (Signed)
 Addended by: Deri Fleet A on: 01/19/2024 10:45 AM   Modules accepted: Orders

## 2024-01-19 NOTE — Telephone Encounter (Signed)
 Pt said that she decided to stay with the [facility]. She has a mammogram scheduled on 01/29/24 but would like PCP to put an order in for mammogram and DEXA so she can see if she can get them both done at appt. Pt will call tomorrow to see if that's an option

## 2024-01-22 ENCOUNTER — Telehealth: Payer: Self-pay | Admitting: Family Medicine

## 2024-01-22 DIAGNOSIS — E2839 Other primary ovarian failure: Secondary | ICD-10-CM

## 2024-01-22 NOTE — Telephone Encounter (Signed)
 Copied from CRM [id number]. Topic: Clinical - Request for Lab/Test Order >> Jan 22, 2024  9:24 AM Artemio Larry wrote: Reason for CRM: [facility] is no longer doing bone density scans, patient needs bone density order faxed to [facility] so she can schedule.

## 2024-01-23 NOTE — Telephone Encounter (Signed)
 Copied from CRM [id number]. Topic: General - Other >> Jan 23, 2024  3:26 PM Allyne Areola wrote: Reason for CRM: Patient is returning a call she received from the office,informed patient of Dr.Tower's message that the order was placed and she can call to schedule.

## 2024-01-23 NOTE — Addendum Note (Signed)
 Addended by: Deri Fleet A on: 01/23/2024 08:48 AM   Modules accepted: Orders

## 2024-01-23 NOTE — Telephone Encounter (Signed)
 I put the order in She can call to schedule Thanks

## 2024-01-23 NOTE — Telephone Encounter (Signed)
 Called pt and no answer and pt's vm box is full. No message left

## 2024-01-29 ENCOUNTER — Ambulatory Visit
Admission: RE | Admit: 2024-01-29 | Discharge: 2024-01-29 | Disposition: A | Discharge status: Home / Self Care | Payer: Self-pay | Source: Ambulatory Visit | Attending: Family Medicine | Admitting: Family Medicine

## 2024-01-29 DIAGNOSIS — Z1231 Encounter for screening mammogram for malignant neoplasm of breast: Secondary | ICD-10-CM

## 2024-02-05 ENCOUNTER — Ambulatory Visit: Payer: Self-pay | Admitting: Family Medicine

## 2024-02-12 ENCOUNTER — Ambulatory Visit (HOSPITAL_BASED_OUTPATIENT_CLINIC_OR_DEPARTMENT_OTHER)
Admission: RE | Admit: 2024-02-12 | Discharge: 2024-02-12 | Disposition: A | Discharge status: Home / Self Care | Source: Ambulatory Visit | Attending: Family Medicine | Admitting: Family Medicine

## 2024-02-12 ENCOUNTER — Ambulatory Visit: Payer: Self-pay | Admitting: Family Medicine

## 2024-02-12 DIAGNOSIS — E2839 Other primary ovarian failure: Secondary | ICD-10-CM | POA: Insufficient documentation

## 2024-02-26 ENCOUNTER — Encounter: Payer: Self-pay | Admitting: Family Medicine

## 2024-03-03 ENCOUNTER — Ambulatory Visit (INDEPENDENT_AMBULATORY_CARE_PROVIDER_SITE_OTHER): Payer: Self-pay | Admitting: Family Medicine

## 2024-03-03 ENCOUNTER — Encounter: Payer: Self-pay | Admitting: Family Medicine

## 2024-03-03 ENCOUNTER — Ambulatory Visit: Payer: Self-pay | Admitting: Family Medicine

## 2024-03-03 VITALS — BP 126/82 | HR 78 | Temp 98.4°F | Ht 59.75 in | Wt 200.0 lb

## 2024-03-03 DIAGNOSIS — Z6839 Body mass index (BMI) 39.0-39.9, adult: Secondary | ICD-10-CM

## 2024-03-03 DIAGNOSIS — R7303 Prediabetes: Secondary | ICD-10-CM

## 2024-03-03 DIAGNOSIS — D72819 Decreased white blood cell count, unspecified: Secondary | ICD-10-CM | POA: Insufficient documentation

## 2024-03-03 DIAGNOSIS — E6609 Other obesity due to excess calories: Secondary | ICD-10-CM | POA: Diagnosis not present

## 2024-03-03 DIAGNOSIS — Z0000 Encounter for general adult medical examination without abnormal findings: Secondary | ICD-10-CM | POA: Diagnosis not present

## 2024-03-03 DIAGNOSIS — Z13220 Encounter for screening for lipoid disorders: Secondary | ICD-10-CM | POA: Diagnosis not present

## 2024-03-03 DIAGNOSIS — E66812 Obesity, class 2: Secondary | ICD-10-CM | POA: Diagnosis not present

## 2024-03-03 LAB — COMPREHENSIVE METABOLIC PANEL WITH GFR
ALT: 16 U/L (ref 0–35)
AST: 18 U/L (ref 0–37)
Albumin: 4.1 g/dL (ref 3.5–5.2)
Alkaline Phosphatase: 62 U/L (ref 39–117)
BUN: 14 mg/dL (ref 6–23)
CO2: 31 meq/L (ref 19–32)
Calcium: 9.5 mg/dL (ref 8.4–10.5)
Chloride: 101 meq/L (ref 96–112)
Creatinine, Ser: 0.72 mg/dL (ref 0.40–1.20)
GFR: 87.1 mL/min (ref >60.00)
Glucose, Bld: 86 mg/dL (ref 70–99)
Potassium: 4.1 meq/L (ref 3.5–5.1)
Sodium: 140 meq/L (ref 135–145)
Total Bilirubin: 0.6 mg/dL (ref 0.2–1.2)
Total Protein: 6.5 g/dL (ref 6.0–8.3)

## 2024-03-03 LAB — LIPID PANEL
Cholesterol: 207 mg/dL — ABNORMAL HIGH (ref 0–200)
HDL: 71.8 mg/dL (ref >39.00)
LDL Cholesterol: 124 mg/dL — ABNORMAL HIGH (ref 0–99)
NonHDL: 135.61
Total CHOL/HDL Ratio: 3
Triglycerides: 58 mg/dL (ref 0.0–149.0)
VLDL: 11.6 mg/dL (ref 0.0–40.0)

## 2024-03-03 LAB — CBC WITH DIFFERENTIAL/PLATELET
Basophils Absolute: 0 10*3/uL (ref 0.0–0.1)
Basophils Relative: 1.2 % (ref 0.0–3.0)
Eosinophils Absolute: 0.1 10*3/uL (ref 0.0–0.7)
Eosinophils Relative: 1.9 % (ref 0.0–5.0)
HCT: 40.7 % (ref 36.0–46.0)
Hemoglobin: 13.4 g/dL (ref 12.0–15.0)
Lymphocytes Relative: 33.7 % (ref 12.0–46.0)
Lymphs Abs: 0.9 10*3/uL (ref 0.7–4.0)
MCHC: 33.1 g/dL (ref 30.0–36.0)
MCV: 89.3 fl (ref 78.0–100.0)
Monocytes Absolute: 0.2 10*3/uL (ref 0.1–1.0)
Monocytes Relative: 8.4 % (ref 3.0–12.0)
Neutro Abs: 1.5 10*3/uL (ref 1.4–7.7)
Neutrophils Relative %: 54.8 % (ref 43.0–77.0)
Platelets: 310 10*3/uL (ref 150.0–400.0)
RBC: 4.55 Mil/uL (ref 3.87–5.11)
RDW: 12.5 % (ref 11.5–15.5)
WBC: 2.7 10*3/uL — ABNORMAL LOW (ref 4.0–10.5)

## 2024-03-03 LAB — TSH: TSH: 1.7 u[IU]/mL (ref 0.35–5.50)

## 2024-03-03 LAB — HEMOGLOBIN A1C: Hgb A1c MFr Bld: 6 % (ref 4.6–6.5)
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
## 2024-03-03 NOTE — Patient Instructions (Addendum)
 Keep exercising  Especially strength training  Add some strength training to your routine, this is important for bone and brain health and can reduce your risk of falls and help your body use insulin properly and regulate weight  Light weights, exercise bands , and internet videos are a good way to start  Yoga (chair or regular), machines , floor exercises or a gym with machines are also good options    Labs today   Take care of yourself

## 2024-03-03 NOTE — Assessment & Plan Note (Signed)
 A1c ordered  disc imp of low glycemic diet and wt loss to prevent DM2

## 2024-03-03 NOTE — Assessment & Plan Note (Signed)
 Reviewed health habits including diet and exercise and skin cancer prevention Reviewed appropriate screening tests for age  Also reviewed health mt list, fam hx and immunization status , as well as social and family history   See HPI Labs reviewed and ordered Health Maintenance  Topic Date Due   Hepatitis C Screening  Never done   Zoster (Shingles) Vaccine (1 of 2) Never done   COVID-19 Vaccine (4 - 2024-25 season) 03/19/2025*   Flu Shot  04/09/2024   Mammogram  01/28/2026   DTaP/Tdap/Td vaccine (3 - Td or Tdap) 08/27/2027   Colon Cancer Screening  07/30/2032   Pneumococcal Vaccine for age over 62  Completed   DEXA scan (bone density measurement)  Completed   Hepatitis B Vaccine  Aged Out   HPV Vaccine  Aged Out   Meningitis B Vaccine  Aged Out  *Topic was postponed. The date shown is not the original due date.    Discussed fall prevention, supplements and exercise for bone density  No falls  PHQ 1 Encouraged more strength building exercise

## 2024-03-03 NOTE — Assessment & Plan Note (Signed)
 Discussed how this problem influences overall health and the risks it imposes  Reviewed plan for weight loss with lower calorie diet (via better food choices (lower glycemic and portion control) along with exercise building up to or more than 30 minutes 5 days per week including some aerobic activity and strength training

## 2024-03-04 ENCOUNTER — Telehealth: Payer: Self-pay | Admitting: *Deleted

## 2024-03-04 NOTE — Telephone Encounter (Signed)
 Pt is aware via mychart.  Per Dr. Randeen pt needs a non fasting lab appt in 3-4 weeks for re check, please schedule when able thanks

## 2024-03-31 ENCOUNTER — Ambulatory Visit: Payer: Self-pay | Admitting: Family Medicine

## 2024-03-31 ENCOUNTER — Other Ambulatory Visit (INDEPENDENT_AMBULATORY_CARE_PROVIDER_SITE_OTHER)

## 2024-03-31 ENCOUNTER — Other Ambulatory Visit

## 2024-03-31 DIAGNOSIS — D72819 Decreased white blood cell count, unspecified: Secondary | ICD-10-CM | POA: Diagnosis not present

## 2024-03-31 LAB — CBC WITH DIFFERENTIAL/PLATELET
Basophils Absolute: 0 K/uL (ref 0.0–0.1)
Basophils Relative: 1.4 % (ref 0.0–3.0)
Eosinophils Absolute: 0.1 K/uL (ref 0.0–0.7)
Eosinophils Relative: 3.7 % (ref 0.0–5.0)
HCT: 39.9 % (ref 36.0–46.0)
Hemoglobin: 13.2 g/dL (ref 12.0–15.0)
Lymphocytes Relative: 27.3 % (ref 12.0–46.0)
Lymphs Abs: 0.9 K/uL (ref 0.7–4.0)
MCHC: 33.2 g/dL (ref 30.0–36.0)
MCV: 90.1 fl (ref 78.0–100.0)
Monocytes Absolute: 0.3 K/uL (ref 0.1–1.0)
Monocytes Relative: 10.2 % (ref 3.0–12.0)
Neutro Abs: 1.8 K/uL (ref 1.4–7.7)
Neutrophils Relative %: 57.4 % (ref 43.0–77.0)
Platelets: 304 K/uL (ref 150.0–400.0)
RBC: 4.43 Mil/uL (ref 3.87–5.11)
RDW: 12.7 % (ref 11.5–15.5)
WBC: 3.2 K/uL — ABNORMAL LOW (ref 4.0–10.5)
